# Patient Record
Sex: Female | Born: 1995 | Race: White | Hispanic: No | Smoking: Never smoker
Health system: Southern US, Community
[De-identification: ages and names within clinical notes are randomized; demographics above are authoritative.]

## PROBLEM LIST (undated history)

## (undated) DIAGNOSIS — R112 Nausea with vomiting, unspecified: Secondary | ICD-10-CM

## (undated) DIAGNOSIS — N83209 Unspecified ovarian cyst, unspecified side: Secondary | ICD-10-CM

## (undated) DIAGNOSIS — S069XAA Unspecified intracranial injury with loss of consciousness status unknown, initial encounter: Secondary | ICD-10-CM

## (undated) DIAGNOSIS — F32A Depression, unspecified: Secondary | ICD-10-CM

## (undated) DIAGNOSIS — S069X9A Unspecified intracranial injury with loss of consciousness of unspecified duration, initial encounter: Secondary | ICD-10-CM

## (undated) DIAGNOSIS — K5900 Constipation, unspecified: Secondary | ICD-10-CM

## (undated) DIAGNOSIS — F419 Anxiety disorder, unspecified: Secondary | ICD-10-CM

## (undated) DIAGNOSIS — N809 Endometriosis, unspecified: Secondary | ICD-10-CM

## (undated) DIAGNOSIS — Z9889 Other specified postprocedural states: Secondary | ICD-10-CM

## (undated) DIAGNOSIS — N200 Calculus of kidney: Secondary | ICD-10-CM

## (undated) HISTORY — DX: Other specified postprocedural states: R11.2

## (undated) HISTORY — DX: Other specified postprocedural states: Z98.890

## (undated) HISTORY — PX: LAPAROTOMY: SHX154

## (undated) HISTORY — PX: LAPAROSCOPY: SHX197

## (undated) HISTORY — DX: Depression, unspecified: F32.A

## (undated) HISTORY — PX: APPENDECTOMY: SHX54

## (undated) HISTORY — PX: COLONOSCOPY: SHX174

## (undated) HISTORY — DX: Anxiety disorder, unspecified: F41.9

---
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
## 2015-03-03 ENCOUNTER — Other Ambulatory Visit: Payer: Self-pay

## 2015-08-08 ENCOUNTER — Other Ambulatory Visit: Payer: Self-pay | Admitting: Obstetrics

## 2015-08-19 ENCOUNTER — Encounter (HOSPITAL_BASED_OUTPATIENT_CLINIC_OR_DEPARTMENT_OTHER): Payer: Self-pay | Admitting: *Deleted

## 2015-08-19 ENCOUNTER — Emergency Department (HOSPITAL_BASED_OUTPATIENT_CLINIC_OR_DEPARTMENT_OTHER)
Admission: EM | Admit: 2015-08-19 | Discharge: 2015-08-19 | Disposition: A | Discharge status: Home / Self Care | Payer: BC Managed Care – PPO | Attending: Emergency Medicine | Admitting: Emergency Medicine

## 2015-08-19 ENCOUNTER — Emergency Department (HOSPITAL_BASED_OUTPATIENT_CLINIC_OR_DEPARTMENT_OTHER): Payer: BC Managed Care – PPO

## 2015-08-19 DIAGNOSIS — Z79899 Other long term (current) drug therapy: Secondary | ICD-10-CM | POA: Insufficient documentation

## 2015-08-19 DIAGNOSIS — Z87442 Personal history of urinary calculi: Secondary | ICD-10-CM | POA: Insufficient documentation

## 2015-08-19 DIAGNOSIS — Z87820 Personal history of traumatic brain injury: Secondary | ICD-10-CM | POA: Insufficient documentation

## 2015-08-19 DIAGNOSIS — Z8742 Personal history of other diseases of the female genital tract: Secondary | ICD-10-CM | POA: Diagnosis not present

## 2015-08-19 DIAGNOSIS — K5900 Constipation, unspecified: Secondary | ICD-10-CM | POA: Insufficient documentation

## 2015-08-19 DIAGNOSIS — R319 Hematuria, unspecified: Secondary | ICD-10-CM | POA: Diagnosis present

## 2015-08-19 DIAGNOSIS — Z791 Long term (current) use of non-steroidal anti-inflammatories (NSAID): Secondary | ICD-10-CM | POA: Diagnosis not present

## 2015-08-19 DIAGNOSIS — Z3202 Encounter for pregnancy test, result negative: Secondary | ICD-10-CM | POA: Insufficient documentation

## 2015-08-19 DIAGNOSIS — R300 Dysuria: Secondary | ICD-10-CM | POA: Diagnosis not present

## 2015-08-19 LAB — URINE MICROSCOPIC-ADD ON

## 2015-08-19 LAB — BASIC METABOLIC PANEL
Anion gap: 10 (ref 5–15)
BUN: 11 mg/dL (ref 6–20)
CHLORIDE: 103 mmol/L (ref 101–111)
CO2: 27 mmol/L (ref 22–32)
Calcium: 9.3 mg/dL (ref 8.9–10.3)
Creatinine, Ser: 0.51 mg/dL (ref 0.44–1.00)
GFR calc Af Amer: >60 mL/min (ref >60)
GFR calc non Af Amer: >60 mL/min (ref >60)
GLUCOSE: 81 mg/dL (ref 65–99)
POTASSIUM: 3.6 mmol/L (ref 3.5–5.1)
SODIUM: 140 mmol/L (ref 135–145)

## 2015-08-19 LAB — CBC WITH DIFFERENTIAL/PLATELET
Basophils Absolute: 0 10*3/uL (ref 0.0–0.1)
Basophils Relative: 0 %
EOS PCT: 1 %
Eosinophils Absolute: 0.1 10*3/uL (ref 0.0–0.7)
HCT: 40.7 % (ref 36.0–46.0)
Hemoglobin: 13.4 g/dL (ref 12.0–15.0)
LYMPHS PCT: 15 %
Lymphs Abs: 2 10*3/uL (ref 0.7–4.0)
MCH: 26.9 pg (ref 26.0–34.0)
MCHC: 32.9 g/dL (ref 30.0–36.0)
MCV: 81.7 fL (ref 78.0–100.0)
MONOS PCT: 7 %
Monocytes Absolute: 1 10*3/uL (ref 0.1–1.0)
Neutro Abs: 10.4 10*3/uL — ABNORMAL HIGH (ref 1.7–7.7)
Neutrophils Relative %: 77 %
Platelets: 449 10*3/uL — ABNORMAL HIGH (ref 150–400)
RBC: 4.98 MIL/uL (ref 3.87–5.11)
RDW: 14.2 % (ref 11.5–15.5)
WBC: 13.5 10*3/uL — AB (ref 4.0–10.5)

## 2015-08-19 LAB — URINALYSIS, ROUTINE W REFLEX MICROSCOPIC
Bilirubin Urine: NEGATIVE
Glucose, UA: NEGATIVE mg/dL
Ketones, ur: NEGATIVE mg/dL
LEUKOCYTES UA: NEGATIVE
Nitrite: NEGATIVE
PROTEIN: 30 mg/dL — AB
Specific Gravity, Urine: 1.021 (ref 1.005–1.030)
pH: 8 (ref 5.0–8.0)

## 2015-08-19 LAB — PREGNANCY, URINE: Preg Test, Ur: NEGATIVE

## 2015-08-19 MED ORDER — NITROFURANTOIN MONOHYD MACRO 100 MG PO CAPS: 100.0000 mg | ORAL_CAPSULE | Freq: Two times a day (BID) | ORAL | Status: DC

## 2015-08-19 NOTE — ED Notes (Signed)
Explained to Pt. We will wait on the results of the [place] before she gets H2O.

## 2015-08-19 NOTE — ED Notes (Signed)
Kidney stone 2 weeks ago. Dysuria x 2 days and hematuria today.

## 2015-08-19 NOTE — ED Notes (Signed)
Water given to pt per her request.

## 2015-08-19 NOTE — ED Provider Notes (Signed)
CSN: [id number]     Arrival date & time 08/19/15  1056 History   First MD Initiated Contact with Patient 08/19/15 1216     Chief Complaint  Patient presents with  . Hematuria     (Consider location/radiation/quality/duration/timing/severity/associated sxs/prior Treatment) HPI Meredith Mcclain is a 20 y.o. female with history of appendectomy, kidney stones comes in for evaluation of hematuria and kidney stone pain. Patient reports 2 weeks ago she passed a stone and since that time has has had similar symptoms associated with kidney stone. She does report urinary burning, urgency, hesitancy and some blood in her urine. She has tried Azo with some relief of her symptoms. She denies any back or flank pain. No unusual vaginal bleeding or discharge, pelvic pain. She denies any fevers, chills, nausea or vomiting, diarrhea or constipation. Last bowel movement this morning and normal for her. Last menstrual period 2 weeks ago and normal. No discomfort in the ED.  Past Medical History  Diagnosis Date  . Kidney stone   . Constipation   . Endometriosis   . Ovarian cyst   . TBI (traumatic brain injury) [facility])    Past Surgical History  Procedure Laterality Date  . Appendectomy    . Laparoscopy    . Laparotomy    . Colonoscopy     No family history on file. Social History  Substance Use Topics  . Smoking status: Passive Smoke Exposure - Never Smoker  . Smokeless tobacco: None  . Alcohol Use: No   OB History    No data available     Review of Systems A 10 point review of systems was completed and was negative except for pertinent positives and negatives as mentioned in the history of present illness     Allergies  Review of patient's allergies indicates no known allergies.  Home Medications   Prior to Admission medications   Medication Sig Start Date End Date Taking? Authorizing Provider  DULoxetine (CYMBALTA) 60 MG capsule Take 60 mg by mouth daily.    Historical Provider, MD   ibuprofen (ADVIL,MOTRIN) 200 MG tablet Take 400 mg by mouth every 6 (six) hours as needed for pain.    Historical Provider, MD  meclizine (ANTIVERT) 25 MG tablet Take 25 mg by mouth 3 (three) times daily as needed (for vertigo).    Historical Provider, MD  naproxen (NAPROSYN) 500 MG tablet Take 1 tablet (500 mg total) by mouth 2 (two) times daily. 07/08/13   Niel Hummer, MD  nitrofurantoin, macrocrystal-monohydrate, (MACROBID) 100 MG capsule Take 1 capsule (100 mg total) by mouth 2 (two) times daily. 08/19/15   Joycie Peek, PA-C  omeprazole (PRILOSEC OTC) 20 MG tablet Take 20 mg by mouth daily.    Historical Provider, MD  senna (SENOKOT) 8.6 MG tablet Take 1 tablet by mouth daily.    Historical Provider, MD   BP 113/80 mmHg  Pulse 73  Temp(Src) 98.3 F (36.8 C) (Oral)  Resp 18  Ht  (1.549 m)  Wt 67.586 kg  BMI 28.17 kg/m2  SpO2 100%  LMP 08/05/2015 (Approximate) Physical Exam  Constitutional: She is oriented to person, place, and time. She appears well-developed and well-nourished. No distress.  Overall well-appearing Caucasian female  HENT:  Head: Normocephalic and atraumatic.  Mouth/Throat: Oropharynx is clear and moist.  Eyes: Conjunctivae are normal. Pupils are equal, round, and reactive to light. Right eye exhibits no discharge. Left eye exhibits no discharge. No scleral icterus.  Neck: Normal range of motion. Neck supple.  Cardiovascular: Normal rate, regular rhythm and normal heart sounds.   Pulmonary/Chest: Effort normal and breath sounds normal. No respiratory distress. She has no wheezes. She has no rales.  Abdominal: Soft. She exhibits no distension and no mass. There is no tenderness. There is no rebound and no guarding.  No CVA tenderness  Musculoskeletal: Normal range of motion. She exhibits no tenderness.  Neurological: She is alert and oriented to person, place, and time.  Cranial Nerves II-XII grossly intact  Skin: Skin is warm and dry. No rash noted. She  is not diaphoretic.  Psychiatric: She has a normal mood and affect.  Nursing note and vitals reviewed.   ED Course  Procedures (including critical care time) Labs Review Labs Reviewed  URINALYSIS, ROUTINE W REFLEX MICROSCOPIC (NOT AT [facility]) - Abnormal; Notable for the following:    Hgb urine dipstick LARGE (*)    Protein, ur 30 (*)    All other components within normal limits  URINE MICROSCOPIC-ADD ON - Abnormal; Notable for the following:    Squamous Epithelial / LPF 0-5 (*)    Bacteria, UA FEW (*)    All other components within normal limits  CBC WITH DIFFERENTIAL/PLATELET - Abnormal; Notable for the following:    WBC 13.5 (*)    Platelets 449 (*)    Neutro Abs 10.4 (*)    All other components within normal limits  URINE CULTURE  PREGNANCY, URINE  BASIC METABOLIC PANEL    Imaging Review US Renal  08/19/2015  CLINICAL DATA:  Hematuria and dysuria for 2 days. The patient reports she passed a kidney stone 2 weeks ago. Initial encounter. EXAM: RENAL / URINARY TRACT ULTRASOUND COMPLETE COMPARISON:  CT abdomen and pelvis 09/02/2012. FINDINGS: Right Kidney: Length: 9.7 cm. Echogenicity within normal limits. No mass or hydronephrosis visualized. Left Kidney: Length: 9.8 cm. Echogenicity within normal limits. No mass or hydronephrosis visualized. Bladder: Appears normal for degree of bladder distention. IMPRESSION: Negative exam. Electronically Signed   By: Drusilla Kanner M.D.   On: 08/19/2015 13:21   I have personally reviewed and evaluated these images and lab results as part of my medical decision-making.   EKG Interpretation None     Meds given in ED:  Medications - No data to display  Discharge Medication List as of 08/19/2015  3:19 PM    START taking these medications   Details  nitrofurantoin, macrocrystal-monohydrate, (MACROBID) 100 MG capsule Take 1 capsule (100 mg total) by mouth 2 (two) times daily., Starting 08/19/2015, Until Discontinued, Print       Filed  Vitals:   08/19/15 1104 08/19/15 1325 08/19/15 1520  BP: 122/80 116/83 113/80  Pulse: 95 80 73  Temp: 98.3 F (36.8 C)    TempSrc: Oral    Resp: Height:  (1.549 m)    Weight: 67.586 kg    SpO2: 100% 100% 100%    MDM  Meredith Mcclain is a 20 y.o. female history of kidney stones, comes in for evaluation of dysuria. Arrival, patient is hemodynamically stable with normal vital signs, afebrile. She appears very well. Exam is unremarkable, no abdominal tenderness or CVA tenderness. Denies any abdominal pain or back pain, nausea or vomiting. Urinalysis shows evidence of large hemoglobin with some protein. We'll obtain right upper quadrant ultrasound to evaluate for possible stone/hydronephrosis. Also will obtain basic labs to evaluate kidney function. Ultrasound is within normal limits. Creatinine is 0.51. She does have a nonspecific leukocytosis of 13.5 with 10.4 absolute neutrophils.  Given patient's complaint of dysuria, hemoglobin in the urine as well as leukocytosis, we'll treat empirically for UTI with Macrobid. Urine culture obtained. No evidence of septic stone, urosepsis, pyelonephritis. Overall, patient appears well, nontoxic, hemodynamically stable and appropriate for discharge. Also given referral to [facility] if symptoms do not improve. Encouraged follow-up with PCP next week. Patient verbalizes understanding and agrees with this plan as well as subsequent discharge. Prior to patient discharge, I discussed and reviewed this case with Dr.Rees    Final diagnoses:  Dysuria       Joycie Peek, PA-C 08/19/15 1624  Tilden Fossa, MD 08/20/15 [phone]

## 2015-08-19 NOTE — Discharge Instructions (Signed)
There does not appear to be an emergent cause your symptoms at this time. You will be treated for a urinary tract infection with antibiotics. Please take his medications as prescribed. Do not save or share these medications. Follow-up with your doctor for reevaluation as needed. You may also follow up with [facility] if your symptoms do not improve. Return to ED for any new or worsening symptoms as we discussed  Dysuria Dysuria is pain or discomfort while urinating. The pain or discomfort may be felt in the tube that carries urine out of the bladder (urethra) or in the surrounding tissue of the genitals. The pain may also be felt in the groin area, lower abdomen, and lower back. You may have to urinate frequently or have the sudden feeling that you have to urinate (urgency). Dysuria can affect both men and women, but is more common in women. Dysuria can be caused by many different things, including:  Urinary tract infection in women.  Infection of the kidney or bladder.  Kidney stones or bladder stones.  Certain sexually transmitted infections (STIs), such as chlamydia.  Dehydration.  Inflammation of the vagina.  Use of certain medicines.  Use of certain soaps or scented products that cause irritation. HOME CARE INSTRUCTIONS Watch your dysuria for any changes. The following actions may help to reduce any discomfort you are feeling:  Drink enough fluid to keep your urine clear or pale yellow.  Empty your bladder often. Avoid holding urine for long periods of time.  After a bowel movement or urination, women should cleanse from front to back, using each tissue only once.  Empty your bladder after sexual intercourse.  Take medicines only as directed by your health care provider.  If you were prescribed an antibiotic medicine, finish it all even if you start to feel better.  Avoid caffeine, tea, and alcohol. They can irritate the bladder and make dysuria worse. In  men, alcohol may irritate the prostate.  Keep all follow-up visits as directed by your health care provider. This is important.  If you had any tests done to find the cause of dysuria, it is your responsibility to obtain your test results. Ask the lab or department performing the test when and how you will get your results. Talk with your health care provider if you have any questions about your results. SEEK MEDICAL CARE IF:  You develop pain in your back or sides.  You have a fever.  You have nausea or vomiting.  You have blood in your urine.  You are not urinating as often as you usually do. SEEK IMMEDIATE MEDICAL CARE IF:  You pain is severe and not relieved with medicines.  You are unable to hold down any fluids.  You or someone else notices a change in your mental function.  You have a rapid heartbeat at rest.  You have shaking or chills.  You feel extremely weak.   This information is not intended to replace advice given to you by your health care provider. Make sure you discuss any questions you have with your health care provider.   Document Released: 04/06/2004 Document Revised: 07/30/2014 Document Reviewed: 03/04/2014 Elsevier Interactive Patient Education [employer]

## 2015-08-22 LAB — URINE CULTURE

## 2015-11-02 IMAGING — CT HEAD^HEAD_ROUTINE (ADULT)
1 series · 16 of 30 positions shown, 20 images · non-contrast
Comparison: None.

CLINICAL DATA: Headache

EXAM:
CT HEAD WITHOUT CONTRAST
TECHNIQUE: Contiguous axial images were obtained from the base of the skull
through the vertex without intravenous contrast.

[Series 2: head 5.0 h30s · axial · 0.42mm/px · z∈[+1407,+1542]mm · 16 of 31 slices shown, 20 images]
[im 2/31  brain]
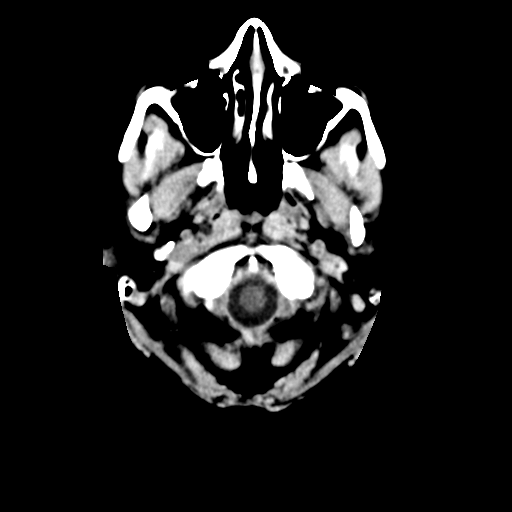
[im 2/31  bone]
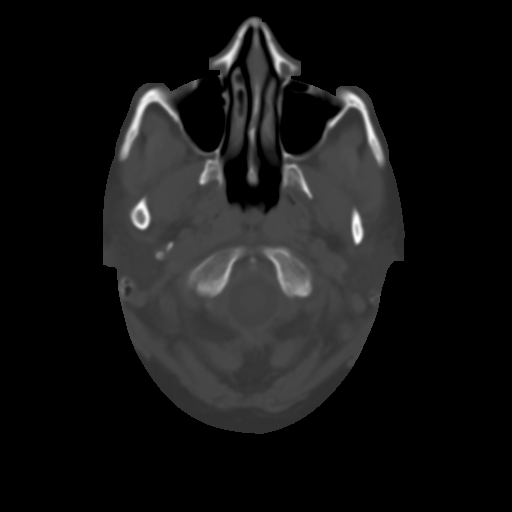
[im 4/31  brain]
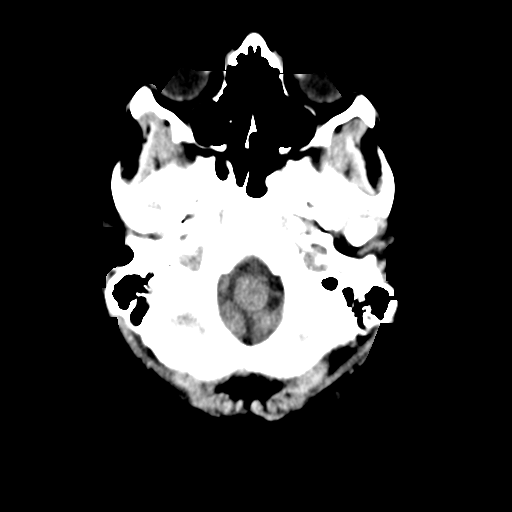
[im 6/31  brain]
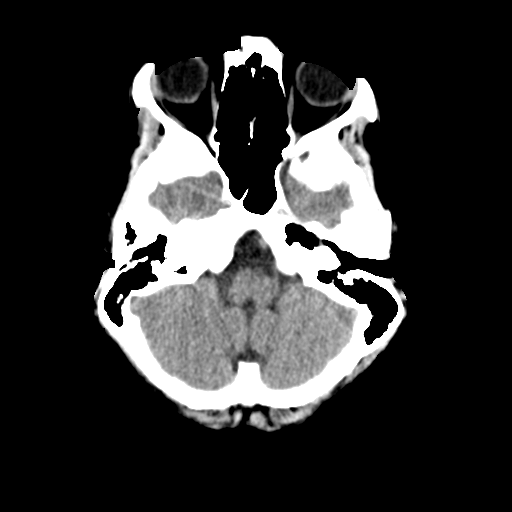
[im 8/31  brain]
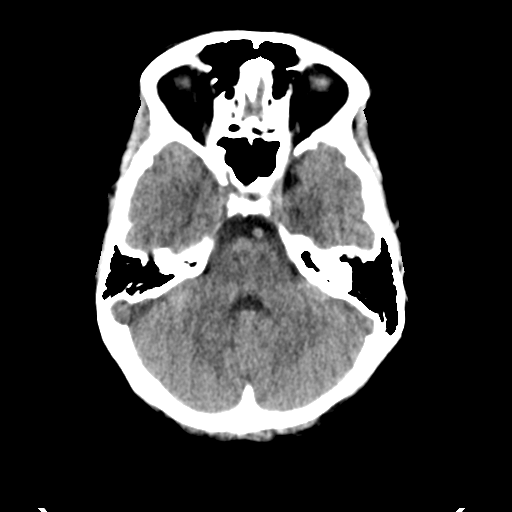
[im 9/31  brain]
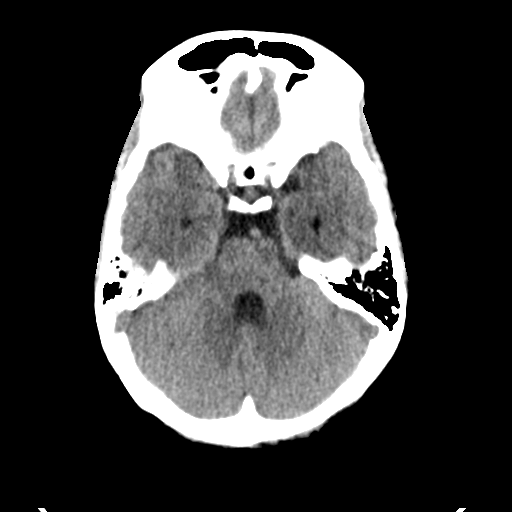
[im 9/31  bone]
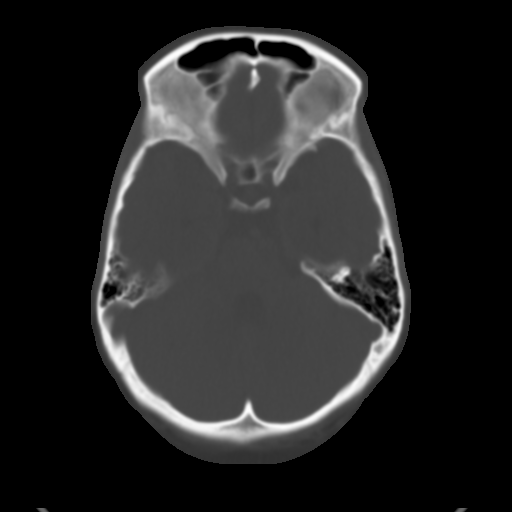
[im 11/31  brain]
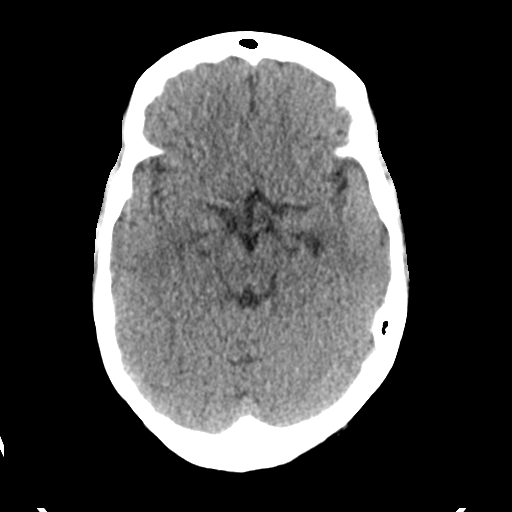
[im 13/31  brain]
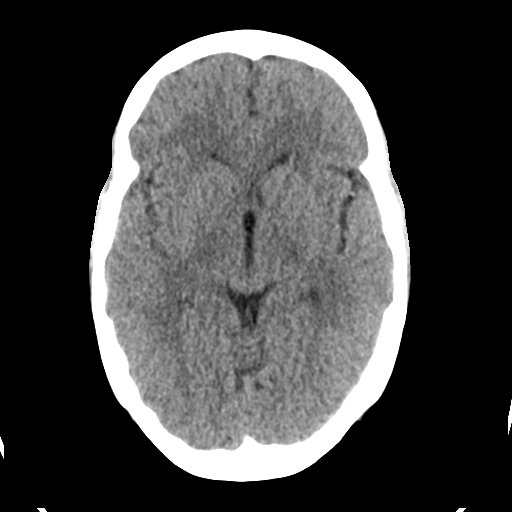
[im 15/31  brain]
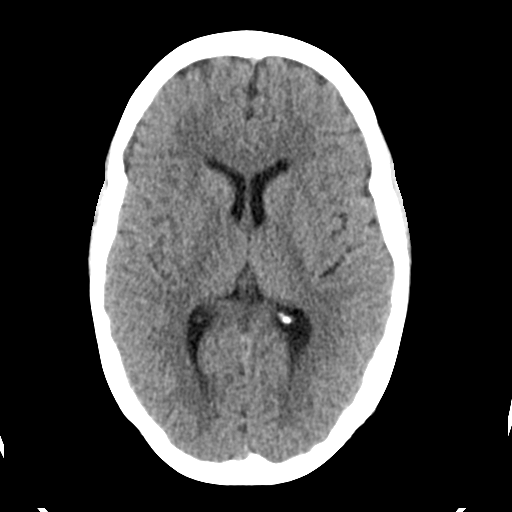
[im 16/31  brain]
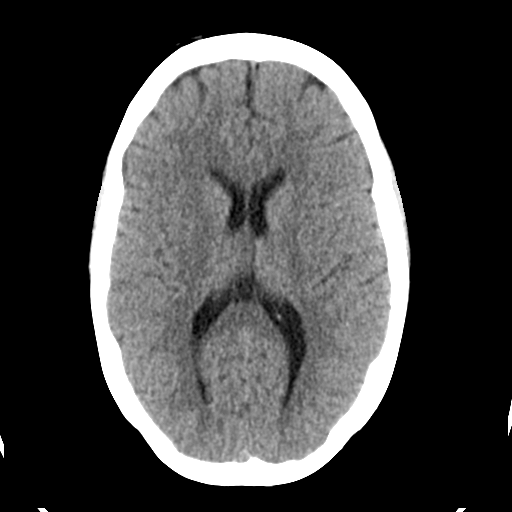
[im 16/31  bone]
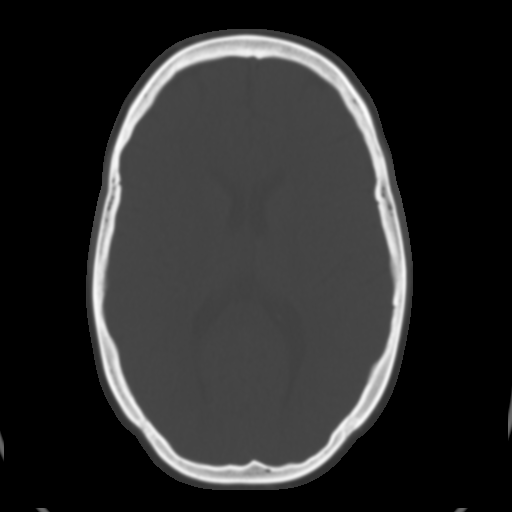
[im 18/31  brain]
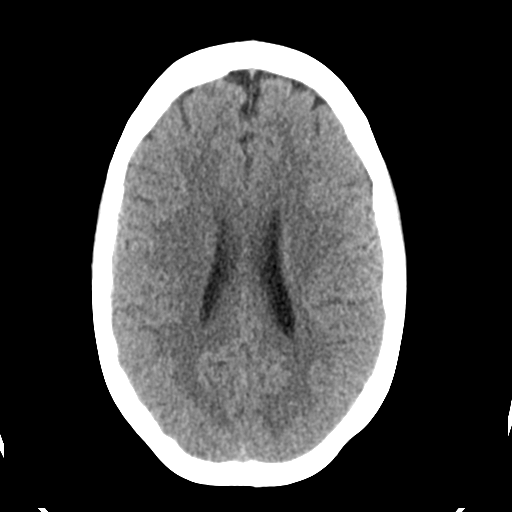
[im 20/31  brain]
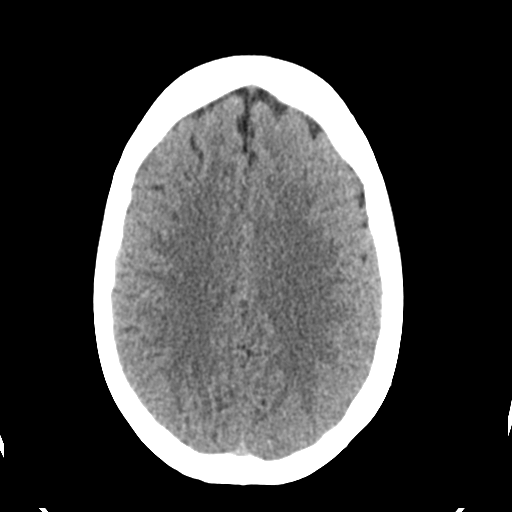
[im 22/31  brain]
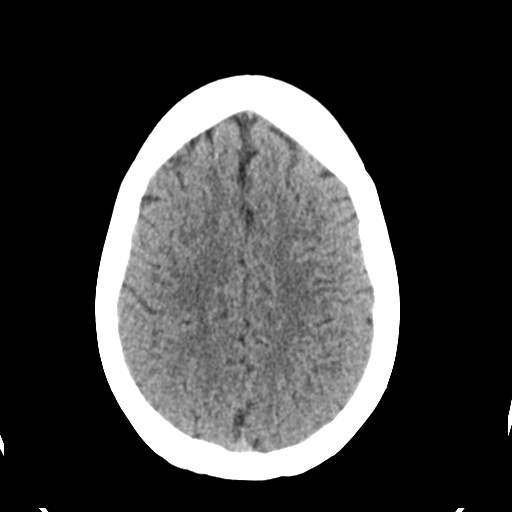
[im 23/31  brain]
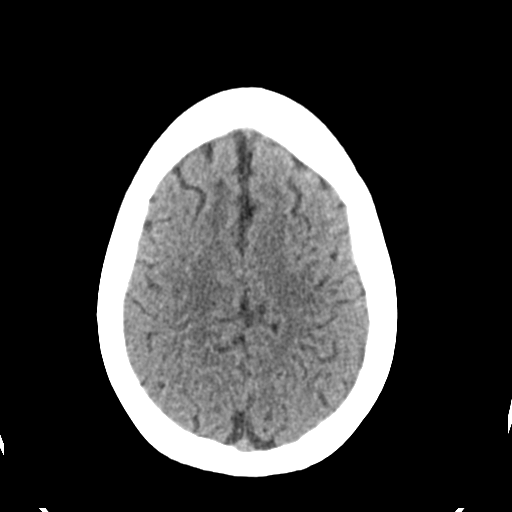
[im 23/31  bone]
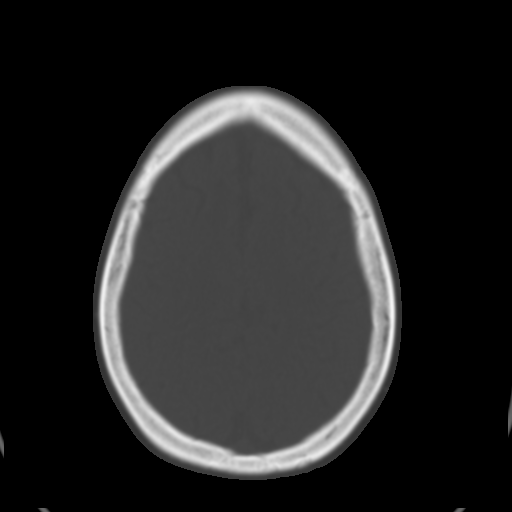
[im 25/31  brain]
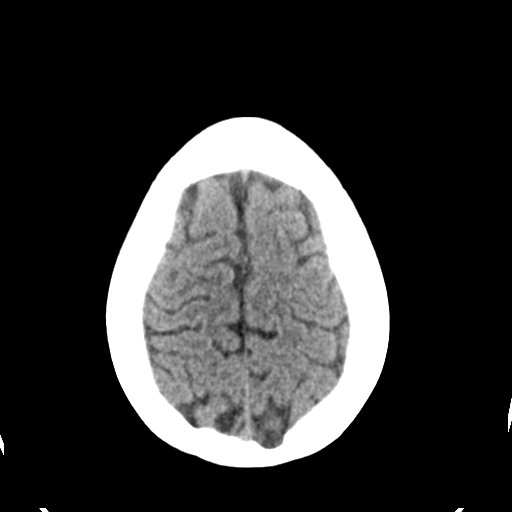
[im 27/31  brain]
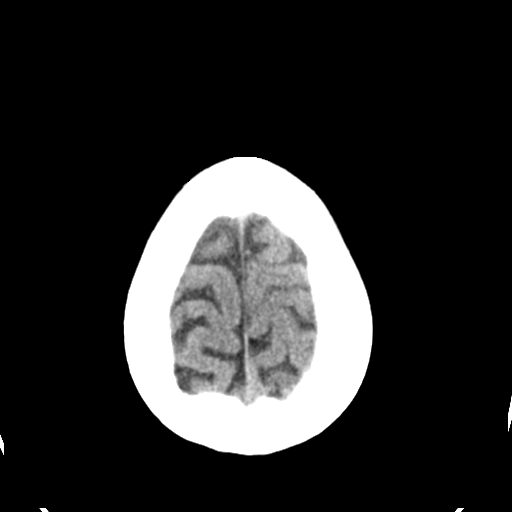
[im 29/31  brain]
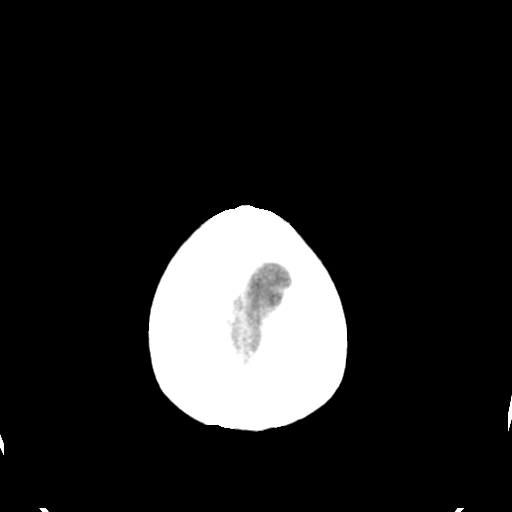

[16 of 30 positions shown; findings below may reference images not displayed]

FINDINGS: No skull fracture is noted. Paranasal sinuses and mastoid air cells
are unremarkable. No intracranial hemorrhage, mass effect or midline
shift. No acute infarction. No hydrocephalus. The gray and
white-matter differentiation is preserved.
IMPRESSION: No acute intracranial abnormality.

## 2017-10-04 ENCOUNTER — Ambulatory Visit (INDEPENDENT_AMBULATORY_CARE_PROVIDER_SITE_OTHER): Payer: BC Managed Care – PPO | Admitting: Physician Assistant

## 2017-10-04 ENCOUNTER — Encounter (INDEPENDENT_AMBULATORY_CARE_PROVIDER_SITE_OTHER): Payer: Self-pay | Admitting: Physician Assistant

## 2017-10-04 ENCOUNTER — Ambulatory Visit (INDEPENDENT_AMBULATORY_CARE_PROVIDER_SITE_OTHER): Payer: Self-pay

## 2017-10-04 ENCOUNTER — Ambulatory Visit (INDEPENDENT_AMBULATORY_CARE_PROVIDER_SITE_OTHER): Payer: BC Managed Care – PPO

## 2017-10-04 DIAGNOSIS — M222X1 Patellofemoral disorders, right knee: Secondary | ICD-10-CM | POA: Diagnosis not present
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
## 2018-02-05 ENCOUNTER — Telehealth (INDEPENDENT_AMBULATORY_CARE_PROVIDER_SITE_OTHER): Payer: Self-pay | Admitting: Orthopaedic Surgery

## 2018-02-05 NOTE — Telephone Encounter (Signed)
Returned call to patient  Left message to return call concerning her medical records being sent to [facility].

## 2019-01-19 ENCOUNTER — Other Ambulatory Visit (HOSPITAL_COMMUNITY): Payer: Self-pay | Admitting: Internal Medicine

## 2019-01-19 ENCOUNTER — Other Ambulatory Visit: Payer: Self-pay | Admitting: Internal Medicine

## 2019-01-19 DIAGNOSIS — N644 Mastodynia: Secondary | ICD-10-CM

## 2019-01-27 ENCOUNTER — Ambulatory Visit (HOSPITAL_COMMUNITY)
Admission: RE | Admit: 2019-01-27 | Discharge: 2019-01-27 | Disposition: A | Discharge status: Home / Self Care | Payer: PRIVATE HEALTH INSURANCE | Source: Ambulatory Visit | Attending: Internal Medicine | Admitting: Internal Medicine

## 2019-01-27 ENCOUNTER — Other Ambulatory Visit: Payer: Self-pay

## 2019-01-27 DIAGNOSIS — N644 Mastodynia: Secondary | ICD-10-CM | POA: Diagnosis not present

## 2019-05-07 ENCOUNTER — Other Ambulatory Visit: Payer: Self-pay

## 2019-05-07 DIAGNOSIS — Z20822 Contact with and (suspected) exposure to covid-19: Secondary | ICD-10-CM

## 2019-05-08 ENCOUNTER — Telehealth: Payer: Self-pay | Admitting: Internal Medicine

## 2019-05-08 LAB — NOVEL CORONAVIRUS, NAA: SARS-CoV-2, NAA: NOT DETECTED

## 2019-05-08 NOTE — Telephone Encounter (Signed)
Patient is calling to receive her negative COVID testing results. Patient expressed understanding. °

## 2019-07-29 ENCOUNTER — Ambulatory Visit
Admission: EM | Admit: 2019-07-29 | Discharge: 2019-07-29 | Disposition: A | Discharge status: Home / Self Care | Payer: PRIVATE HEALTH INSURANCE | Attending: Emergency Medicine | Admitting: Emergency Medicine

## 2019-07-29 ENCOUNTER — Other Ambulatory Visit: Payer: Self-pay

## 2019-07-29 ENCOUNTER — Encounter: Payer: Self-pay | Admitting: Emergency Medicine

## 2019-07-29 DIAGNOSIS — R109 Unspecified abdominal pain: Secondary | ICD-10-CM | POA: Insufficient documentation

## 2019-07-29 LAB — POCT URINALYSIS DIP (MANUAL ENTRY)
Bilirubin, UA: NEGATIVE
Glucose, UA: NEGATIVE mg/dL
Leukocytes, UA: NEGATIVE
Nitrite, UA: NEGATIVE
Protein Ur, POC: NEGATIVE mg/dL
Spec Grav, UA: 1.025 (ref 1.010–1.025)
Urobilinogen, UA: 0.2 E.U./dL
pH, UA: 7 (ref 5.0–8.0)

## 2019-07-29 LAB — POCT URINE PREGNANCY: Preg Test, Ur: NEGATIVE

## 2019-07-29 MED ORDER — IBUPROFEN 600 MG PO TABS: 600.0000 mg | ORAL_TABLET | Freq: Four times a day (QID) | ORAL | 0 refills | Status: DC | PRN

## 2019-07-29 NOTE — Discharge Instructions (Addendum)
Urine analysis showed trace of blood Urine culture sent.  We will call you with the results.   Push fluids and get plenty of rest.   Follow up with PCP if symptoms persists Return here or go to ER if you have any new or worsening symptoms such as fever, worsening abdominal pain, nausea/vomiting, flank pain

## 2019-07-29 NOTE — ED Triage Notes (Signed)
Right flank pain started last night.  Urinating without difficulty .denies diarrhea, but softer stools and more frequent that usual.  No vomiting

## 2019-07-29 NOTE — ED Provider Notes (Signed)
[facility]    CSN: [id number] Arrival date & time: 07/29/19  1556      History   Chief Complaint Chief Complaint  Patient presents with  . Flank Pain    HPI Meredith Mcclain is a 24 y.o. female.   Meredith Mcclain 24 years old female presented to the urgent care with a complaint of flank pain for the past 1 day.  She denies a precipitating event or recent sexual encounter.  She has tried Ibuprofen with mild relief.  Her symptoms are made worse with urination.  She reports similar symptoms in the past that improved with antibiotic treatment.  She denied  decreased amount, increased urgency.  She denies fever, chills, nausea, vomiting, abdominal pain, hematuria, or incontinence  The history is provided by the patient. A language interpreter was used.  Flank Pain    Past Medical History:  Diagnosis Date  . Constipation   . Endometriosis   . Kidney stone   . Ovarian cyst   . TBI (traumatic brain injury) [facility]     Patient Active Problem List   Diagnosis Date Noted  . 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 APPENDECTOMY    . COLONOSCOPY    . LAPAROSCOPY    . LAPAROTOMY      OB History   No obstetric history on file.      Home Medications    Prior to Admission medications   Medication Sig Start Date End Date Taking? Authorizing Provider  ibuprofen (ADVIL) 600 MG tablet Take 1 tablet (600 mg total) by mouth every 6 (six) hours as needed. Take with food 07/29/19   Valetta Mulroy, Darrelyn Hillock, FNP  Multiple Vitamins-Minerals (MULTIVITAMIN ADULT PO) Microgestin 1/20 (21) 1 mg-20 mcg tablet    [provider]    Family History History reviewed. No pertinent family history.  Social History Social History   Tobacco Use  . Smoking status: Passive Smoke Exposure - Never Smoker  . Smokeless tobacco: Never Used  Substance Use Topics  . Alcohol use: Yes  . Drug use: Not on file      Allergies   Patient has no known allergies.   Review of Systems Review of Systems  Constitutional: Negative.   HENT: Negative.   Respiratory: Negative.   Cardiovascular: Negative.   Gastrointestinal: Negative.   Genitourinary: Positive for flank pain.  ROS: All other are negative   Physical Exam Triage Vital Signs ED Triage Vitals  Enc Vitals Group     BP 07/29/19 1609 129/88     Pulse Rate 07/29/19 1609 100     Resp 07/29/19 1609 20     Temp 07/29/19 1609 98 F (36.7 C)     Temp Source 07/29/19 1609 Oral     SpO2 07/29/19 1609 98 %     Weight --      Height --      Head Circumference --      Peak Flow --      Pain Score 07/29/19 1605 7     Pain Loc --      Pain Edu? --      Excl. in [place]? --    No data found.  Updated Vital Signs BP 129/88 (BP Location: Right Arm)   Pulse 100   Temp 98 F (36.7 C) (Oral)   Resp 20   SpO2 98%   Visual Acuity Right Eye Distance:   Left Eye  Distance:   Bilateral Distance:    Right Eye Near:   Left Eye Near:    Bilateral Near:     Physical Exam Vitals and nursing note reviewed.  Constitutional:      General: She is not in acute distress.    Appearance: Normal appearance. She is normal weight. She is not ill-appearing or toxic-appearing.  Cardiovascular:     Rate and Rhythm: Normal rate and regular rhythm.     Pulses: Normal pulses.     Heart sounds: Normal heart sounds. No murmur.  Pulmonary:     Effort: Pulmonary effort is normal. No respiratory distress.     Breath sounds: No wheezing or rhonchi.  Chest:     Chest wall: No tenderness.  Abdominal:     General: Abdomen is flat. Bowel sounds are normal. There is no distension.     Palpations: There is no mass.     Tenderness: There is no abdominal tenderness. There is no right CVA tenderness, left CVA tenderness, guarding or rebound.     Hernia: No hernia is present.  Skin:    Capillary Refill: Capillary refill takes less than 2 seconds.  Neurological:      Mental Status: She is alert and oriented to person, place, and time.      UC Treatments / Results  Labs (all labs ordered are listed, but only abnormal results are displayed) Labs Reviewed  POCT URINALYSIS DIP (MANUAL ENTRY) - Abnormal; Notable for the following components:      Result Value   Color, UA yellow (*)    Ketones, POC UA trace (5) (*)    Blood, UA trace-intact (*)    All other components within normal limits  URINE CULTURE  POCT URINE PREGNANCY    EKG   Radiology No results found.  Procedures Procedures (including critical care time)  Medications Ordered in UC Medications - No data to display  Initial Impression / Assessment and Plan / UC Course  I have reviewed the triage vital signs and the nursing notes.  Pertinent labs & imaging results that were available during my care of the patient were reviewed by me and considered in my medical decision making (see chart for details).   Point-of-care urine analysis and  urine pregnancy test were ordered and result reviewed.  UA show trace of red blood cell and trace of bilirubin.  Urine will be sent for culture.  Advised patient to return for worsening of symptoms.  Patient verbalized an understanding of the plan of care.  Final Clinical Impressions(s) / UC Diagnoses   Final diagnoses:  Flank pain     Discharge Instructions     Urine analysis showed trace of blood Urine culture sent.  We will call you with the results.   Push fluids and get plenty of rest.   Follow up with PCP if symptoms persists Return here or go to ER if you have any new or worsening symptoms such as fever, worsening abdominal pain, nausea/vomiting, flank pain     ED Prescriptions    Medication Sig Dispense Auth. Provider   ibuprofen (ADVIL) 600 MG tablet Take 1 tablet (600 mg total) by mouth every 6 (six) hours as needed. Take with food 30 tablet Makhayla Mcmurry, Zachery Dakins, FNP     PDMP not reviewed this encounter.   Durward Parcel,  FNP 07/29/19 1639

## 2019-07-31 ENCOUNTER — Telehealth: Payer: Self-pay | Admitting: Emergency Medicine

## 2019-07-31 LAB — URINE CULTURE

## 2019-07-31 MED ORDER — CEPHALEXIN 500 MG PO CAPS: 500.0000 mg | ORAL_CAPSULE | Freq: Two times a day (BID) | ORAL | 0 refills | Status: AC

## 2019-07-31 MED ORDER — PHENAZOPYRIDINE HCL 200 MG PO TABS: 200.0000 mg | ORAL_TABLET | Freq: Three times a day (TID) | ORAL | 0 refills | Status: DC

## 2019-07-31 NOTE — Telephone Encounter (Signed)
Pt called with persistent symptoms.  Urine with unspecified growth.  Will treat for possible UTI.  Keflex and pyridium sent into [facility] pharmacy.

## 2019-07-31 NOTE — ED Notes (Signed)
Returned pt call no answer; message left

## 2019-12-17 LAB — OB RESULTS CONSOLE GC/CHLAMYDIA
Chlamydia: NEGATIVE
Gonorrhea: NEGATIVE

## 2020-01-18 LAB — OB RESULTS CONSOLE ABO/RH: RH Type: POSITIVE

## 2020-01-18 LAB — OB RESULTS CONSOLE RPR: RPR: NONREACTIVE

## 2020-01-18 LAB — OB RESULTS CONSOLE HEPATITIS B SURFACE ANTIGEN: Hepatitis B Surface Ag: NEGATIVE

## 2020-01-18 LAB — OB RESULTS CONSOLE RUBELLA ANTIBODY, IGM: Rubella: IMMUNE

## 2020-01-18 LAB — OB RESULTS CONSOLE HIV ANTIBODY (ROUTINE TESTING): HIV: NONREACTIVE

## 2020-06-15 ENCOUNTER — Encounter: Payer: BC Managed Care – PPO | Attending: Obstetrics & Gynecology | Admitting: Registered"

## 2020-06-15 ENCOUNTER — Other Ambulatory Visit: Payer: Self-pay

## 2020-06-15 DIAGNOSIS — O24419 Gestational diabetes mellitus in pregnancy, unspecified control: Secondary | ICD-10-CM | POA: Insufficient documentation

## 2020-06-20 ENCOUNTER — Encounter: Payer: Self-pay | Admitting: Registered"

## 2020-06-20 DIAGNOSIS — O24419 Gestational diabetes mellitus in pregnancy, unspecified control: Secondary | ICD-10-CM | POA: Insufficient documentation

## 2020-06-20 NOTE — Progress Notes (Signed)
Patient was seen on 06/15/20 for Gestational Diabetes self-management class at the [facility]. The following learning objectives were met by the patient during this course:   States the definition of Gestational Diabetes  States why dietary management is important in controlling blood glucose  Describes the effects each nutrient has on blood glucose levels  Demonstrates ability to create a balanced meal plan  Demonstrates carbohydrate counting   States when to check blood glucose levels  Demonstrates proper blood glucose monitoring techniques  States the effect of stress and exercise on blood glucose levels  States the importance of limiting caffeine and abstaining from alcohol and smoking  Blood glucose monitor given: none - patient has meter and checking CBGs  Patient instructed to monitor glucose levels: FBS: 60 - <95; 1 hour: <140; 2 hour: <120  Patient received handouts:  Nutrition Diabetes and Pregnancy, including carb counting list  Patient will be seen for follow-up as needed.

## 2020-06-24 ENCOUNTER — Other Ambulatory Visit: Payer: Self-pay | Admitting: Obstetrics and Gynecology

## 2020-06-24 DIAGNOSIS — O99213 Obesity complicating pregnancy, third trimester: Secondary | ICD-10-CM

## 2020-06-24 DIAGNOSIS — O24410 Gestational diabetes mellitus in pregnancy, diet controlled: Secondary | ICD-10-CM

## 2020-06-24 DIAGNOSIS — Z3A35 35 weeks gestation of pregnancy: Secondary | ICD-10-CM

## 2020-06-24 DIAGNOSIS — Z363 Encounter for antenatal screening for malformations: Secondary | ICD-10-CM

## 2020-07-06 ENCOUNTER — Encounter: Payer: Self-pay | Admitting: *Deleted

## 2020-07-08 ENCOUNTER — Encounter: Payer: Self-pay | Admitting: *Deleted

## 2020-07-08 ENCOUNTER — Other Ambulatory Visit (HOSPITAL_COMMUNITY): Payer: Self-pay | Admitting: Obstetrics and Gynecology

## 2020-07-08 ENCOUNTER — Ambulatory Visit: Payer: PRIVATE HEALTH INSURANCE | Admitting: *Deleted

## 2020-07-08 ENCOUNTER — Other Ambulatory Visit: Payer: Self-pay

## 2020-07-08 ENCOUNTER — Ambulatory Visit: Payer: PRIVATE HEALTH INSURANCE | Attending: Obstetrics and Gynecology

## 2020-07-08 VITALS — BP 112/55 | HR 111

## 2020-07-08 DIAGNOSIS — O24419 Gestational diabetes mellitus in pregnancy, unspecified control: Secondary | ICD-10-CM | POA: Diagnosis present

## 2020-07-08 DIAGNOSIS — Z363 Encounter for antenatal screening for malformations: Secondary | ICD-10-CM | POA: Diagnosis present

## 2020-07-08 DIAGNOSIS — O24410 Gestational diabetes mellitus in pregnancy, diet controlled: Secondary | ICD-10-CM

## 2020-07-08 DIAGNOSIS — O99213 Obesity complicating pregnancy, third trimester: Secondary | ICD-10-CM

## 2020-07-08 DIAGNOSIS — Z3A35 35 weeks gestation of pregnancy: Secondary | ICD-10-CM | POA: Diagnosis present

## 2020-07-16 ENCOUNTER — Inpatient Hospital Stay (HOSPITAL_COMMUNITY)
Admission: AD | Admit: 2020-07-16 | Discharge: 2020-07-16 | Disposition: A | Discharge status: Home / Self Care | Payer: PRIVATE HEALTH INSURANCE | Source: Ambulatory Visit | Attending: Obstetrics and Gynecology | Admitting: Obstetrics and Gynecology

## 2020-07-16 ENCOUNTER — Encounter (HOSPITAL_COMMUNITY): Payer: Self-pay | Admitting: Obstetrics and Gynecology

## 2020-07-16 ENCOUNTER — Other Ambulatory Visit: Payer: Self-pay

## 2020-07-16 DIAGNOSIS — O479 False labor, unspecified: Secondary | ICD-10-CM

## 2020-07-16 DIAGNOSIS — Z3A36 36 weeks gestation of pregnancy: Secondary | ICD-10-CM | POA: Insufficient documentation

## 2020-07-16 NOTE — Discharge Instructions (Signed)
Braxton Hicks Contractions °Contractions of the uterus can occur throughout pregnancy, but they are not always a sign that you are in labor. You may have practice contractions called Braxton Hicks contractions. These false labor contractions are sometimes confused with true labor. °What are Braxton Hicks contractions? °Braxton Hicks contractions are tightening movements that occur in the muscles of the uterus before labor. Unlike true labor contractions, these contractions do not result in opening (dilation) and thinning of the cervix. Toward the end of pregnancy (32-34 weeks), Braxton Hicks contractions can happen more often and may become stronger. These contractions are sometimes difficult to tell apart from true labor because they can be very uncomfortable. You should not feel embarrassed if you go to the hospital with false labor. °Sometimes, the only way to tell if you are in true labor is for your health care provider to look for changes in the cervix. The health care provider will do a physical exam and may monitor your contractions. If you are not in true labor, the exam should show that your cervix is not dilating and your water has not broken. °If there are no other health problems associated with your pregnancy, it is completely safe for you to be sent home with false labor. You may continue to have Braxton Hicks contractions until you go into true labor. °How to tell the difference between true labor and false labor °True labor °· Contractions last 30-70 seconds. °· Contractions become very regular. °· Discomfort is usually felt in the top of the uterus, and it spreads to the lower abdomen and low back. °· Contractions do not go away with walking. °· Contractions usually become more intense and increase in frequency. °· The cervix dilates and gets thinner. °False labor °· Contractions are usually shorter and not as strong as true labor contractions. °· Contractions are usually irregular. °· Contractions  are often felt in the front of the lower abdomen and in the groin. °· Contractions may go away when you walk around or change positions while lying down. °· Contractions get weaker and are shorter-lasting as time goes on. °· The cervix usually does not dilate or become thin. °Follow these instructions at home: ° °· Take over-the-counter and prescription medicines only as told by your health care provider. °· Keep up with your usual exercises and follow other instructions from your health care provider. °· Eat and drink lightly if you think you are going into labor. °· If Braxton Hicks contractions are making you uncomfortable: °? Change your position from lying down or resting to walking, or change from walking to resting. °? Sit and rest in a tub of warm water. °? Drink enough fluid to keep your urine pale yellow. Dehydration may cause these contractions. °? Do slow and deep breathing several times an hour. °· Keep all follow-up prenatal visits as told by your health care provider. This is important. °Contact a health care provider if: °· You have a fever. °· You have continuous pain in your abdomen. °Get help right away if: °· Your contractions become stronger, more regular, and closer together. °· You have fluid leaking or gushing from your vagina. °· You pass blood-tinged mucus (bloody show). °· You have bleeding from your vagina. °· You have low back pain that you never had before. °· You feel your baby’s head pushing down and causing pelvic pressure. °· Your baby is not moving inside you as much as it used to. °Summary °· Contractions that occur before labor are   called Braxton Hicks contractions, false labor, or practice contractions. °· Braxton Hicks contractions are usually shorter, weaker, farther apart, and less regular than true labor contractions. True labor contractions usually become progressively stronger and regular, and they become more frequent. °· Manage discomfort from Braxton Hicks contractions  by changing position, resting in a warm bath, drinking plenty of water, or practicing deep breathing. °This information is not intended to replace advice given to you by your health care provider. Make sure you discuss any questions you have with your health care provider. °Document Revised: 06/21/2017 Document Reviewed: 11/22/2016 °Elsevier Patient Education © 2020 Elsevier Inc. ° °

## 2020-07-16 NOTE — MAU Note (Signed)
.  Meredith Mcclain is a 24 y.o. at [redacted]w[redacted]d here in MAU reporting: ctx that started around 1500 this afternoon. They are 10 minutes apart now. No VB or LOF. Endorses good fetal movement.

## 2020-07-20 LAB — OB RESULTS CONSOLE GBS: GBS: NEGATIVE

## 2020-07-23 ENCOUNTER — Encounter (HOSPITAL_COMMUNITY): Payer: Self-pay | Admitting: Obstetrics and Gynecology

## 2020-07-23 ENCOUNTER — Other Ambulatory Visit: Payer: Self-pay

## 2020-07-23 ENCOUNTER — Inpatient Hospital Stay (HOSPITAL_COMMUNITY)
Admission: AD | Admit: 2020-07-23 | Discharge: 2020-07-23 | Disposition: A | Discharge status: Home / Self Care | Payer: PRIVATE HEALTH INSURANCE | Attending: Obstetrics and Gynecology | Admitting: Obstetrics and Gynecology

## 2020-07-23 DIAGNOSIS — Z0371 Encounter for suspected problem with amniotic cavity and membrane ruled out: Secondary | ICD-10-CM | POA: Diagnosis present

## 2020-07-23 DIAGNOSIS — Z3A37 37 weeks gestation of pregnancy: Secondary | ICD-10-CM | POA: Insufficient documentation

## 2020-07-23 DIAGNOSIS — O471 False labor at or after 37 completed weeks of gestation: Secondary | ICD-10-CM

## 2020-07-23 DIAGNOSIS — Z3483 Encounter for supervision of other normal pregnancy, third trimester: Secondary | ICD-10-CM | POA: Insufficient documentation

## 2020-07-23 NOTE — Discharge Instructions (Signed)
Braxton Hicks Contractions Contractions of the uterus can occur throughout pregnancy, but they are not always a sign that you are in labor. You may have practice contractions called Braxton Hicks contractions. These false labor contractions are sometimes confused with true labor. What are Braxton Hicks contractions? Braxton Hicks contractions are tightening movements that occur in the muscles of the uterus before labor. Unlike true labor contractions, these contractions do not result in opening (dilation) and thinning of the cervix. Toward the end of pregnancy (32-34 weeks), Braxton Hicks contractions can happen more often and may become stronger. These contractions are sometimes difficult to tell apart from true labor because they can be very uncomfortable. You should not feel embarrassed if you go to the hospital with false labor. Sometimes, the only way to tell if you are in true labor is for your health care provider to look for changes in the cervix. The health care provider will do a physical exam and may monitor your contractions. If you are not in true labor, the exam should show that your cervix is not dilating and your water has not broken. If there are no other health problems associated with your pregnancy, it is completely safe for you to be sent home with false labor. You may continue to have Braxton Hicks contractions until you go into true labor. How to tell the difference between true labor and false labor True labor  Contractions last 30-70 seconds.  Contractions become very regular.  Discomfort is usually felt in the top of the uterus, and it spreads to the lower abdomen and low back.  Contractions do not go away with walking.  Contractions usually become more intense and increase in frequency.  The cervix dilates and gets thinner. False labor  Contractions are usually shorter and not as strong as true labor contractions.  Contractions are usually irregular.  Contractions  are often felt in the front of the lower abdomen and in the groin.  Contractions may go away when you walk around or change positions while lying down.  Contractions get weaker and are shorter-lasting as time goes on.  The cervix usually does not dilate or become thin. Follow these instructions at home:   Take over-the-counter and prescription medicines only as told by your health care provider.  Keep up with your usual exercises and follow other instructions from your health care provider.  Eat and drink lightly if you think you are going into labor.  If Braxton Hicks contractions are making you uncomfortable: ? Change your position from lying down or resting to walking, or change from walking to resting. ? Sit and rest in a tub of warm water. ? Drink enough fluid to keep your urine pale yellow. Dehydration may cause these contractions. ? Do slow and deep breathing several times an hour.  Keep all follow-up prenatal visits as told by your health care provider. This is important. Contact a health care provider if:  You have a fever.  You have continuous pain in your abdomen. Get help right away if:  Your contractions become stronger, more regular, and closer together.  You have fluid leaking or gushing from your vagina.  You pass blood-tinged mucus (bloody show).  You have bleeding from your vagina.  You have low back pain that you never had before.  You feel your baby's head pushing down and causing pelvic pressure.  Your baby is not moving inside you as much as it used to. Summary  Contractions that occur before labor are   called Braxton Hicks contractions, false labor, or practice contractions.  Braxton Hicks contractions are usually shorter, weaker, farther apart, and less regular than true labor contractions. True labor contractions usually become progressively stronger and regular, and they become more frequent.  Manage discomfort from Braxton Hicks contractions  by changing position, resting in a warm bath, drinking plenty of water, or practicing deep breathing. This information is not intended to replace advice given to you by your health care provider. Make sure you discuss any questions you have with your health care provider. Document Revised: 06/21/2017 Document Reviewed: 11/22/2016 Elsevier Patient Education  2020 Elsevier Inc. Fetal Movement Counts Patient Name: ________________________________________________ Patient Due Date: ____________________ What is a fetal movement count?  A fetal movement count is the number of times that you feel your baby move during a certain amount of time. This may also be called a fetal kick count. A fetal movement count is recommended for every pregnant woman. You may be asked to start counting fetal movements as early as week 28 of your pregnancy. Pay attention to when your baby is most active. You may notice your baby's sleep and wake cycles. You may also notice things that make your baby move more. You should do a fetal movement count:  When your baby is normally most active.  At the same time each day. A good time to count movements is while you are resting, after having something to eat and drink. How do I count fetal movements? 1. Find a quiet, comfortable area. Sit, or lie down on your side. 2. Write down the date, the start time and stop time, and the number of movements that you felt between those two times. Take this information with you to your health care visits. 3. Write down your start time when you feel the first movement. 4. Count kicks, flutters, swishes, rolls, and jabs. You should feel at least 10 movements. 5. You may stop counting after you have felt 10 movements, or if you have been counting for 2 hours. Write down the stop time. 6. If you do not feel 10 movements in 2 hours, contact your health care provider for further instructions. Your health care provider may want to do additional tests  to assess your baby's well-being. Contact a health care provider if:  You feel fewer than 10 movements in 2 hours.  Your baby is not moving like he or she usually does. Date: ____________ Start time: ____________ Stop time: ____________ Movements: ____________ Date: ____________ Start time: ____________ Stop time: ____________ Movements: ____________ Date: ____________ Start time: ____________ Stop time: ____________ Movements: ____________ Date: ____________ Start time: ____________ Stop time: ____________ Movements: ____________ Date: ____________ Start time: ____________ Stop time: ____________ Movements: ____________ Date: ____________ Start time: ____________ Stop time: ____________ Movements: ____________ Date: ____________ Start time: ____________ Stop time: ____________ Movements: ____________ Date: ____________ Start time: ____________ Stop time: ____________ Movements: ____________ Date: ____________ Start time: ____________ Stop time: ____________ Movements: ____________ This information is not intended to replace advice given to you by your health care provider. Make sure you discuss any questions you have with your health care provider. Document Revised: 02/26/2019 Document Reviewed: 02/26/2019 Elsevier Patient Education  2020 Elsevier Inc.  

## 2020-07-23 NOTE — MAU Note (Signed)
I have communicated with Lynnda Shields, MD., and reviewed vital signs:  Vitals:   07/23/20 0359 07/23/20 0418  BP: 131/83 133/80  Pulse: (!) 108 95  SpO2:  98%    Vaginal exam:  Dilation: 1 Effacement (%): 60 Station: -2 Presentation: Vertex Exam by:: Dr. Lynnda Shields, MD,   Also reviewed contraction pattern and that non-stress test is reactive.  It has been documented that patient is contracting occasionally with no cervical change since previous check not indicating active labor.  Patient denies any other complaints.  Based on this report provider has given order for discharge.  A discharge order and diagnosis entered by a provider.   Labor discharge instructions reviewed with patient.

## 2020-07-23 NOTE — MAU Provider Note (Signed)
Event Date/Time  First Provider Initiated Contact with Patient 07/23/20 [phone]    S: Ms. Meredith Mcclain is a 25 y.o. G2P0010 at [redacted]w[redacted]d  who presents to MAU today complaining of leaking of fluid since approximately 0200 s/p vaginal intercourse at 2300. She denies vaginal bleeding. She endorses rare contractions. She reports normal fetal movement.    O: BP 133/80 (BP Location: Left Arm)   Pulse 95   LMP 11/05/2019   SpO2 98%  GENERAL: Well-developed, well-nourished female in no acute distress.  HEAD: Normocephalic, atraumatic.  CHEST: Normal effort of breathing, regular heart rate ABDOMEN: Soft, nontender, gravid PELVIC: Normal external female genitalia. Vagina is pink and rugated. Cervix with normal contour, no lesions. Normal discharge.  Absent pooling.   Cervical exam:  Dilation: 1 Effacement (%): 60 Station: -2 Presentation: Vertex Exam by:: Dr. Lynnda Shields, MD  Fetal Monitoring: Baseline: 130  Variability: moderate Accelerations: multiple, reactive strip Decelerations: none Contractions: rare  No results found for this or any previous visit (from the past 24 hour(s)).  A: [facility]UP at [redacted]w[redacted]d  Membranes intact  P: -Provided reassurance with strict return precautions for signs/symptosm of term labor, decreased fetal movement, vaginal bleeding. -Plan to f/u with prenatal provider as previously scheduled.  Sheila Oats, MD 07/23/2020 4:24 AM

## 2020-07-23 NOTE — MAU Note (Signed)
...  Meredith Mcclain is a 25 y.o. at [redacted]w[redacted]d here in MAU reporting: I woke up to go to the restroom this morning and when I went to the restroom I noticed my underwear were wet. I wiped myself over and over and it kept coming and it was on my thighs." She also reports when she went back and looked at her bed, there was a wet spot. She is currently experiencing lower back pain during a CTX and rates it 7/10. +FM. No VB.   GBS-

## 2020-08-01 ENCOUNTER — Inpatient Hospital Stay (EMERGENCY_DEPARTMENT_HOSPITAL)
Admission: AD | Admit: 2020-08-01 | Discharge: 2020-08-01 | Disposition: A | Discharge status: Home / Self Care | Payer: PRIVATE HEALTH INSURANCE | Source: Home / Self Care | Attending: Obstetrics | Admitting: Obstetrics

## 2020-08-01 ENCOUNTER — Telehealth (HOSPITAL_COMMUNITY): Payer: Self-pay | Admitting: *Deleted

## 2020-08-01 ENCOUNTER — Encounter (HOSPITAL_COMMUNITY): Payer: Self-pay | Admitting: *Deleted

## 2020-08-01 ENCOUNTER — Encounter (HOSPITAL_COMMUNITY): Payer: Self-pay | Admitting: Obstetrics

## 2020-08-01 DIAGNOSIS — Z3A38 38 weeks gestation of pregnancy: Secondary | ICD-10-CM

## 2020-08-01 DIAGNOSIS — Z0371 Encounter for suspected problem with amniotic cavity and membrane ruled out: Secondary | ICD-10-CM

## 2020-08-01 DIAGNOSIS — O471 False labor at or after 37 completed weeks of gestation: Secondary | ICD-10-CM | POA: Insufficient documentation

## 2020-08-01 LAB — POCT FERN TEST: POCT Fern Test: NEGATIVE

## 2020-08-01 NOTE — MAU Note (Addendum)
PT SAYS SROM-- SAYS LEAKING ALL DAY- STARTED AT 0900.-  PNC WITH DR Chestine Spore-  CALLED OFFICE -TODAY AT 4 PM- TOLD TO COME HERE.  VE IN OFFICE ON Friday  1 CM.  DENIES HSV AND MRSA . GBS- NEG   FEELS SOME UC .

## 2020-08-01 NOTE — MAU Provider Note (Signed)
Event Date/Time   First Provider Initiated Contact with Patient 08/01/20 2133       S: Ms. Meredith Mcclain is a 25 y.o. G2P0010 at [redacted]w[redacted]d  who presents to MAU today complaining of leaking of fluid since this am. She denies vaginal bleeding. She denies contractions. She reports normal fetal movement.    O: BP 128/88 (BP Location: Right Arm)   Pulse 96   Temp 98.2 F (36.8 C)   Resp 20   Ht 5\' 1"  (1.549 m)   Wt 95 kg   LMP 11/05/2019   BMI 39.58 kg/m  GENERAL: Well-developed, well-nourished female in no acute distress.  HEAD: Normocephalic, atraumatic.  CHEST: Normal effort of breathing, regular heart rate ABDOMEN: Soft, nontender, gravid PELVIC: Normal external female genitalia. Vagina is pink and rugated. Cervix with normal contour, no lesions. Normal discharge.  No pooling.   Cervical exam:  Dilation: 1 Effacement (%): Thick Exam by:: [email or web address], CNM   Fetal Monitoring: Baseline: 135 Variability: moderate Accelerations: 15x15, multiple prolonged accels Decelerations: none Contractions: occasional uc's  Fern negative   A: [facility]UP at [redacted]w[redacted]d  Membranes intact  P: -Discharge home in stable condition -Labor precautions discussed -Patient advised to follow-up with [facility] as scheduled for prenatal care -Patient may return to MAU as needed or if her condition were to change or worsen   [facility], Rolm Bookbinder 08/01/2020 9:47 PM

## 2020-08-01 NOTE — Telephone Encounter (Signed)
Preadmission screen  

## 2020-08-01 NOTE — Discharge Instructions (Signed)

## 2020-08-02 ENCOUNTER — Other Ambulatory Visit (HOSPITAL_COMMUNITY): Payer: PRIVATE HEALTH INSURANCE

## 2020-08-04 ENCOUNTER — Encounter (HOSPITAL_COMMUNITY)
Admission: AD | Disposition: A | Discharge status: Home / Self Care | Payer: Self-pay | Source: Home / Self Care | Attending: Obstetrics

## 2020-08-04 ENCOUNTER — Inpatient Hospital Stay (HOSPITAL_COMMUNITY): Payer: PRIVATE HEALTH INSURANCE | Admitting: Certified Registered Nurse Anesthetist

## 2020-08-04 ENCOUNTER — Other Ambulatory Visit: Payer: Self-pay

## 2020-08-04 ENCOUNTER — Encounter (HOSPITAL_COMMUNITY): Payer: Self-pay | Admitting: Obstetrics

## 2020-08-04 ENCOUNTER — Inpatient Hospital Stay (HOSPITAL_COMMUNITY)
Admission: AD | Admit: 2020-08-04 | Discharge: 2020-08-06 | DRG: 788 | Disposition: A | Discharge status: Home / Self Care | Payer: PRIVATE HEALTH INSURANCE | Attending: Obstetrics | Admitting: Obstetrics

## 2020-08-04 ENCOUNTER — Inpatient Hospital Stay (HOSPITAL_COMMUNITY): Payer: PRIVATE HEALTH INSURANCE

## 2020-08-04 DIAGNOSIS — Z20822 Contact with and (suspected) exposure to covid-19: Secondary | ICD-10-CM | POA: Diagnosis present

## 2020-08-04 DIAGNOSIS — O24420 Gestational diabetes mellitus in childbirth, diet controlled: Secondary | ICD-10-CM | POA: Diagnosis present

## 2020-08-04 DIAGNOSIS — Z7722 Contact with and (suspected) exposure to environmental tobacco smoke (acute) (chronic): Secondary | ICD-10-CM | POA: Diagnosis present

## 2020-08-04 DIAGNOSIS — Z3A39 39 weeks gestation of pregnancy: Secondary | ICD-10-CM | POA: Diagnosis not present

## 2020-08-04 DIAGNOSIS — Z98891 History of uterine scar from previous surgery: Secondary | ICD-10-CM

## 2020-08-04 DIAGNOSIS — O99214 Obesity complicating childbirth: Secondary | ICD-10-CM | POA: Diagnosis present

## 2020-08-04 DIAGNOSIS — Z87442 Personal history of urinary calculi: Secondary | ICD-10-CM | POA: Diagnosis not present

## 2020-08-04 DIAGNOSIS — Z3403 Encounter for supervision of normal first pregnancy, third trimester: Secondary | ICD-10-CM

## 2020-08-04 LAB — CBC
HCT: 38.5 % (ref 36.0–46.0)
Hemoglobin: 12.6 g/dL (ref 12.0–15.0)
MCH: 27.6 pg (ref 26.0–34.0)
MCHC: 32.7 g/dL (ref 30.0–36.0)
MCV: 84.4 fL (ref 80.0–100.0)
Platelets: 342 10*3/uL (ref 150–400)
RBC: 4.56 MIL/uL (ref 3.87–5.11)
RDW: 14.6 % (ref 11.5–15.5)
WBC: 11.2 10*3/uL — ABNORMAL HIGH (ref 4.0–10.5)
nRBC: 0 % (ref 0.0–0.2)

## 2020-08-04 LAB — GLUCOSE, CAPILLARY
Glucose-Capillary: 68 mg/dL — ABNORMAL LOW (ref 70–99)
Glucose-Capillary: 72 mg/dL (ref 70–99)
Glucose-Capillary: 84 mg/dL (ref 70–99)

## 2020-08-04 LAB — TYPE AND SCREEN
ABO/RH(D): O POS
Antibody Screen: NEGATIVE

## 2020-08-04 LAB — RESP PANEL BY RT-PCR (FLU A&B, COVID) ARPGX2
Influenza A by PCR: NEGATIVE
Influenza B by PCR: NEGATIVE
SARS Coronavirus 2 by RT PCR: NEGATIVE

## 2020-08-04 SURGERY — Surgical Case
Anesthesia: General

## 2020-08-04 MED ORDER — KETOROLAC TROMETHAMINE 30 MG/ML IJ SOLN
30.0000 mg | Freq: Four times a day (QID) | INTRAMUSCULAR | Status: AC
  Administered 2020-08-05 (×3): 30 mg via INTRAVENOUS
  Filled 2020-08-04 (×3): qty 1

## 2020-08-04 MED ORDER — OXYTOCIN-SODIUM CHLORIDE 30-0.9 UT/500ML-% IV SOLN: 2.5000 [IU]/h | INTRAVENOUS | Status: AC

## 2020-08-04 MED ORDER — WITCH HAZEL-GLYCERIN EX PADS: 1.0000 "application " | MEDICATED_PAD | CUTANEOUS | Status: DC | PRN

## 2020-08-04 MED ORDER — OXYCODONE HCL 5 MG PO TABS: 5.0000 mg | ORAL_TABLET | ORAL | Status: DC | PRN

## 2020-08-04 MED ORDER — ACETAMINOPHEN 10 MG/ML IV SOLN: 1000.0000 mg | Freq: Once | INTRAVENOUS | Status: DC | PRN

## 2020-08-04 MED ORDER — ONDANSETRON HCL 4 MG/2ML IJ SOLN
INTRAMUSCULAR | Status: DC | PRN
  Administered 2020-08-04: 4 mg via INTRAVENOUS

## 2020-08-04 MED ORDER — PROMETHAZINE HCL 25 MG/ML IJ SOLN
6.2500 mg | INTRAMUSCULAR | Status: DC | PRN
Start: 2020-08-04 — End: 2020-08-04
  Administered 2020-08-04: 6.25 mg via INTRAVENOUS

## 2020-08-04 MED ORDER — LIDOCAINE HCL (PF) 1 % IJ SOLN: 30.0000 mL | INTRAMUSCULAR | Status: DC | PRN

## 2020-08-04 MED ORDER — OXYCODONE HCL 5 MG/5ML PO SOLN: 5.0000 mg | Freq: Once | ORAL | Status: DC | PRN

## 2020-08-04 MED ORDER — KETOROLAC TROMETHAMINE 30 MG/ML IJ SOLN: 30.0000 mg | Freq: Four times a day (QID) | INTRAMUSCULAR | Status: AC | PRN

## 2020-08-04 MED ORDER — ACETAMINOPHEN 325 MG PO TABS: 650.0000 mg | ORAL_TABLET | ORAL | Status: DC | PRN

## 2020-08-04 MED ORDER — LACTATED RINGERS IV SOLN: INTRAVENOUS | Status: DC

## 2020-08-04 MED ORDER — HYDROMORPHONE HCL 1 MG/ML IJ SOLN
0.2500 mg | INTRAMUSCULAR | Status: DC | PRN
Start: 2020-08-04 — End: 2020-08-04

## 2020-08-04 MED ORDER — PRENATAL MULTIVITAMIN CH
1.0000 | ORAL_TABLET | Freq: Every day | ORAL | Status: DC
  Administered 2020-08-05: 1 via ORAL
  Filled 2020-08-04: qty 1

## 2020-08-04 MED ORDER — OXYCODONE HCL 5 MG PO TABS: 5.0000 mg | ORAL_TABLET | Freq: Once | ORAL | Status: DC | PRN

## 2020-08-04 MED ORDER — PROMETHAZINE HCL 25 MG/ML IJ SOLN
INTRAMUSCULAR | Status: AC
  Filled 2020-08-04: qty 1

## 2020-08-04 MED ORDER — OXYTOCIN-SODIUM CHLORIDE 30-0.9 UT/500ML-% IV SOLN
2.5000 [IU]/h | INTRAVENOUS | Status: DC
  Filled 2020-08-04: qty 500

## 2020-08-04 MED ORDER — HYDROMORPHONE HCL 1 MG/ML IJ SOLN: 0.2000 mg | INTRAMUSCULAR | Status: DC | PRN

## 2020-08-04 MED ORDER — SCOPOLAMINE 1 MG/3DAYS TD PT72
MEDICATED_PATCH | TRANSDERMAL | Status: AC
  Filled 2020-08-04: qty 1

## 2020-08-04 MED ORDER — TERBUTALINE SULFATE 1 MG/ML IJ SOLN: 0.2500 mg | Freq: Once | INTRAMUSCULAR | Status: DC | PRN

## 2020-08-04 MED ORDER — LIDOCAINE 2% (20 MG/ML) 5 ML SYRINGE
INTRAMUSCULAR | Status: AC
  Filled 2020-08-04: qty 5

## 2020-08-04 MED ORDER — LIDOCAINE HCL (CARDIAC) PF 100 MG/5ML IV SOSY
PREFILLED_SYRINGE | INTRAVENOUS | Status: DC | PRN
  Administered 2020-08-04: 60 mg via INTRAVENOUS

## 2020-08-04 MED ORDER — DEXAMETHASONE SODIUM PHOSPHATE 10 MG/ML IJ SOLN
INTRAMUSCULAR | Status: AC
  Filled 2020-08-04: qty 1

## 2020-08-04 MED ORDER — OXYTOCIN-SODIUM CHLORIDE 30-0.9 UT/500ML-% IV SOLN
INTRAVENOUS | Status: AC
  Filled 2020-08-04: qty 500

## 2020-08-04 MED ORDER — MEPERIDINE HCL 25 MG/ML IJ SOLN
INTRAMUSCULAR | Status: AC
  Filled 2020-08-04: qty 1

## 2020-08-04 MED ORDER — OXYTOCIN-SODIUM CHLORIDE 30-0.9 UT/500ML-% IV SOLN
1.0000 m[IU]/min | INTRAVENOUS | Status: DC
  Administered 2020-08-04: 2 m[IU]/min via INTRAVENOUS

## 2020-08-04 MED ORDER — ONDANSETRON HCL 4 MG/2ML IJ SOLN: 4.0000 mg | Freq: Three times a day (TID) | INTRAMUSCULAR | Status: DC | PRN

## 2020-08-04 MED ORDER — KETOROLAC TROMETHAMINE 30 MG/ML IJ SOLN
30.0000 mg | Freq: Four times a day (QID) | INTRAMUSCULAR | Status: AC | PRN
  Administered 2020-08-04: 30 mg via INTRAVENOUS

## 2020-08-04 MED ORDER — SUCCINYLCHOLINE CHLORIDE 20 MG/ML IJ SOLN
INTRAMUSCULAR | Status: DC | PRN
  Administered 2020-08-04: 180 mg via INTRAVENOUS

## 2020-08-04 MED ORDER — ONDANSETRON HCL 4 MG/2ML IJ SOLN: 4.0000 mg | Freq: Four times a day (QID) | INTRAMUSCULAR | Status: DC | PRN

## 2020-08-04 MED ORDER — FENTANYL CITRATE (PF) 250 MCG/5ML IJ SOLN
INTRAMUSCULAR | Status: AC
  Filled 2020-08-04: qty 5

## 2020-08-04 MED ORDER — DEXAMETHASONE SODIUM PHOSPHATE 10 MG/ML IJ SOLN
INTRAMUSCULAR | Status: DC | PRN
  Administered 2020-08-04: 10 mg via INTRAVENOUS

## 2020-08-04 MED ORDER — OXYCODONE-ACETAMINOPHEN 5-325 MG PO TABS: 2.0000 | ORAL_TABLET | ORAL | Status: DC | PRN

## 2020-08-04 MED ORDER — DIPHENHYDRAMINE HCL 25 MG PO CAPS: 25.0000 mg | ORAL_CAPSULE | Freq: Four times a day (QID) | ORAL | Status: DC | PRN

## 2020-08-04 MED ORDER — ACETAMINOPHEN 500 MG PO TABS
1000.0000 mg | ORAL_TABLET | Freq: Four times a day (QID) | ORAL | Status: DC
  Administered 2020-08-05 – 2020-08-06 (×4): 1000 mg via ORAL
  Filled 2020-08-04 (×6): qty 2

## 2020-08-04 MED ORDER — DIPHENHYDRAMINE HCL 25 MG PO CAPS: 25.0000 mg | ORAL_CAPSULE | ORAL | Status: DC | PRN

## 2020-08-04 MED ORDER — FENTANYL CITRATE (PF) 100 MCG/2ML IJ SOLN
INTRAMUSCULAR | Status: AC
  Filled 2020-08-04: qty 2

## 2020-08-04 MED ORDER — MISOPROSTOL 25 MCG QUARTER TABLET
25.0000 ug | ORAL_TABLET | ORAL | Status: DC | PRN
  Administered 2020-08-04: 25 ug via VAGINAL
  Filled 2020-08-04: qty 1

## 2020-08-04 MED ORDER — PHENYLEPHRINE 40 MCG/ML (10ML) SYRINGE FOR IV PUSH (FOR BLOOD PRESSURE SUPPORT)
PREFILLED_SYRINGE | INTRAVENOUS | Status: AC
  Filled 2020-08-04: qty 10

## 2020-08-04 MED ORDER — FENTANYL CITRATE (PF) 100 MCG/2ML IJ SOLN
50.0000 ug | INTRAMUSCULAR | Status: DC | PRN
  Administered 2020-08-04 (×2): 100 ug via INTRAVENOUS
  Filled 2020-08-04: qty 2

## 2020-08-04 MED ORDER — PROPOFOL 10 MG/ML IV BOLUS
INTRAVENOUS | Status: DC | PRN
  Administered 2020-08-04: 200 mg via INTRAVENOUS

## 2020-08-04 MED ORDER — ALBUMIN HUMAN 5 % IV SOLN: INTRAVENOUS | Status: DC | PRN

## 2020-08-04 MED ORDER — MEPERIDINE HCL 25 MG/ML IJ SOLN
6.2500 mg | INTRAMUSCULAR | Status: AC | PRN
  Administered 2020-08-04 (×2): 6.25 mg via INTRAVENOUS

## 2020-08-04 MED ORDER — CEFAZOLIN SODIUM-DEXTROSE 2-3 GM-%(50ML) IV SOLR
INTRAVENOUS | Status: DC | PRN
  Administered 2020-08-04: 2 g via INTRAVENOUS

## 2020-08-04 MED ORDER — DIBUCAINE (PERIANAL) 1 % EX OINT: 1.0000 "application " | TOPICAL_OINTMENT | CUTANEOUS | Status: DC | PRN

## 2020-08-04 MED ORDER — ACETAMINOPHEN 500 MG PO TABS: 1000.0000 mg | ORAL_TABLET | Freq: Four times a day (QID) | ORAL | Status: DC

## 2020-08-04 MED ORDER — FENTANYL CITRATE (PF) 100 MCG/2ML IJ SOLN
INTRAMUSCULAR | Status: DC | PRN
  Administered 2020-08-04: 250 ug via INTRAVENOUS
  Administered 2020-08-04: 100 ug via INTRAVENOUS

## 2020-08-04 MED ORDER — MENTHOL 3 MG MT LOZG
1.0000 | LOZENGE | OROMUCOSAL | Status: DC | PRN
  Administered 2020-08-05: 3 mg via ORAL
  Filled 2020-08-04: qty 9

## 2020-08-04 MED ORDER — SIMETHICONE 80 MG PO CHEW: 80.0000 mg | CHEWABLE_TABLET | ORAL | Status: DC | PRN

## 2020-08-04 MED ORDER — SOD CITRATE-CITRIC ACID 500-334 MG/5ML PO SOLN: 30.0000 mL | ORAL | Status: DC | PRN

## 2020-08-04 MED ORDER — SUCCINYLCHOLINE CHLORIDE 200 MG/10ML IV SOSY
PREFILLED_SYRINGE | INTRAVENOUS | Status: AC
  Filled 2020-08-04: qty 10

## 2020-08-04 MED ORDER — ONDANSETRON HCL 4 MG/2ML IJ SOLN
INTRAMUSCULAR | Status: AC
  Filled 2020-08-04: qty 2

## 2020-08-04 MED ORDER — ALBUMIN HUMAN 5 % IV SOLN
INTRAVENOUS | Status: AC
  Filled 2020-08-04: qty 250

## 2020-08-04 MED ORDER — ACETAMINOPHEN 10 MG/ML IV SOLN
INTRAVENOUS | Status: DC | PRN
  Administered 2020-08-04: 1000 mg via INTRAVENOUS

## 2020-08-04 MED ORDER — PHENYLEPHRINE HCL (PRESSORS) 10 MG/ML IV SOLN
INTRAVENOUS | Status: DC | PRN
  Administered 2020-08-04 (×2): 40 ug via INTRAVENOUS

## 2020-08-04 MED ORDER — OXYTOCIN-SODIUM CHLORIDE 30-0.9 UT/500ML-% IV SOLN
INTRAVENOUS | Status: DC | PRN
  Administered 2020-08-04: 30 [IU] via INTRAVENOUS

## 2020-08-04 MED ORDER — IBUPROFEN 800 MG PO TABS
800.0000 mg | ORAL_TABLET | Freq: Four times a day (QID) | ORAL | Status: DC
  Administered 2020-08-06: 800 mg via ORAL
  Filled 2020-08-04 (×3): qty 1

## 2020-08-04 MED ORDER — COCONUT OIL OIL: 1.0000 "application " | TOPICAL_OIL | Status: DC | PRN

## 2020-08-04 MED ORDER — PROPOFOL 10 MG/ML IV BOLUS
INTRAVENOUS | Status: AC
  Filled 2020-08-04: qty 20

## 2020-08-04 MED ORDER — SOD CITRATE-CITRIC ACID 500-334 MG/5ML PO SOLN
ORAL | Status: AC
  Administered 2020-08-04: 30 mL via ORAL
  Filled 2020-08-04: qty 15

## 2020-08-04 MED ORDER — KETOROLAC TROMETHAMINE 30 MG/ML IJ SOLN
INTRAMUSCULAR | Status: AC
  Filled 2020-08-04: qty 1

## 2020-08-04 MED ORDER — SCOPOLAMINE 1 MG/3DAYS TD PT72
1.0000 | MEDICATED_PATCH | Freq: Once | TRANSDERMAL | Status: DC
  Administered 2020-08-04: 1.5 mg via TRANSDERMAL

## 2020-08-04 MED ORDER — AMISULPRIDE (ANTIEMETIC) 5 MG/2ML IV SOLN: 10.0000 mg | Freq: Once | INTRAVENOUS | Status: DC | PRN

## 2020-08-04 MED ORDER — LACTATED RINGERS IV SOLN: INTRAVENOUS | Status: DC | PRN

## 2020-08-04 MED ORDER — SIMETHICONE 80 MG PO CHEW
80.0000 mg | CHEWABLE_TABLET | Freq: Three times a day (TID) | ORAL | Status: DC
  Administered 2020-08-05 – 2020-08-06 (×3): 80 mg via ORAL
  Filled 2020-08-04 (×3): qty 1

## 2020-08-04 MED ORDER — OXYTOCIN BOLUS FROM INFUSION: 333.0000 mL | Freq: Once | INTRAVENOUS | Status: DC

## 2020-08-04 MED ORDER — LACTATED RINGERS IV SOLN: 500.0000 mL | INTRAVENOUS | Status: DC | PRN

## 2020-08-04 MED ORDER — SENNOSIDES-DOCUSATE SODIUM 8.6-50 MG PO TABS
2.0000 | ORAL_TABLET | ORAL | Status: DC
  Administered 2020-08-05 – 2020-08-06 (×2): 2 via ORAL
  Filled 2020-08-04 (×2): qty 2

## 2020-08-04 MED ORDER — DIPHENHYDRAMINE HCL 50 MG/ML IJ SOLN: 12.5000 mg | INTRAMUSCULAR | Status: DC | PRN

## 2020-08-04 MED ORDER — ACETAMINOPHEN 10 MG/ML IV SOLN
INTRAVENOUS | Status: AC
  Filled 2020-08-04: qty 100

## 2020-08-04 MED ORDER — OXYCODONE-ACETAMINOPHEN 5-325 MG PO TABS: 1.0000 | ORAL_TABLET | ORAL | Status: DC | PRN

## 2020-08-04 SURGICAL SUPPLY — 38 items
APL SKNCLS STERI-STRIP NONHPOA (GAUZE/BANDAGES/DRESSINGS) ×1
BENZOIN TINCTURE PRP APPL 2/3 (GAUZE/BANDAGES/DRESSINGS) ×2 IMPLANT
CHLORAPREP W/TINT 26ML (MISCELLANEOUS) ×2 IMPLANT
CLAMP CORD UMBIL (MISCELLANEOUS) IMPLANT
CLOTH BEACON ORANGE TIMEOUT ST (SAFETY) ×2 IMPLANT
DRSG OPSITE POSTOP 4X10 (GAUZE/BANDAGES/DRESSINGS) ×2 IMPLANT
ELECT REM PT RETURN 9FT ADLT (ELECTROSURGICAL) ×2
ELECTRODE REM PT RTRN 9FT ADLT (ELECTROSURGICAL) ×1 IMPLANT
EXTRACTOR VACUUM KIWI (MISCELLANEOUS) IMPLANT
GLOVE BIOGEL PI IND STRL 6.5 (GLOVE) ×1 IMPLANT
GLOVE BIOGEL PI IND STRL 7.0 (GLOVE) ×1 IMPLANT
GLOVE BIOGEL PI INDICATOR 6.5 (GLOVE) ×1
GLOVE BIOGEL PI INDICATOR 7.0 (GLOVE) ×1
GLOVE ECLIPSE 6.0 STRL STRAW (GLOVE) ×2 IMPLANT
GOWN STRL REUS W/TWL LRG LVL3 (GOWN DISPOSABLE) ×4 IMPLANT
KIT ABG SYR 3ML LUER SLIP (SYRINGE) IMPLANT
NDL HYPO 25X5/8 SAFETYGLIDE (NEEDLE) IMPLANT
NEEDLE HYPO 25X5/8 SAFETYGLIDE (NEEDLE) IMPLANT
NS IRRIG 1000ML POUR BTL (IV SOLUTION) ×2 IMPLANT
PACK C SECTION WH (CUSTOM PROCEDURE TRAY) ×2 IMPLANT
PAD OB MATERNITY 4.3X12.25 (PERSONAL CARE ITEMS) ×2 IMPLANT
PENCIL SMOKE EVAC W/HOLSTER (ELECTROSURGICAL) ×2 IMPLANT
RTRCTR C-SECT PINK 25CM LRG (MISCELLANEOUS) ×2 IMPLANT
STRIP CLOSURE SKIN 1/2X4 (GAUZE/BANDAGES/DRESSINGS) ×2 IMPLANT
SUT MNCRL 0 VIOLET CTX 36 (SUTURE) ×2 IMPLANT
SUT MONOCRYL 0 CTX 36 (SUTURE) ×4
SUT PLAIN 0 NONE (SUTURE) IMPLANT
SUT PLAIN 2 0 (SUTURE) ×2
SUT PLAIN ABS 2-0 CT1 27XMFL (SUTURE) ×1 IMPLANT
SUT VIC AB 0 CTX 36 (SUTURE) ×4
SUT VIC AB 0 CTX36XBRD ANBCTRL (SUTURE) ×2 IMPLANT
SUT VIC AB 2-0 CT1 (SUTURE) ×1 IMPLANT
SUT VIC AB 2-0 CT1 27 (SUTURE) ×2
SUT VIC AB 2-0 CT1 TAPERPNT 27 (SUTURE) ×1 IMPLANT
SUT VICRYL 4-0 PS2 18IN ABS (SUTURE) ×2 IMPLANT
TOWEL OR 17X24 6PK STRL BLUE (TOWEL DISPOSABLE) ×2 IMPLANT
TRAY FOLEY W/BAG SLVR 14FR LF (SET/KITS/TRAYS/PACK) ×2 IMPLANT
WATER STERILE IRR 1000ML POUR (IV SOLUTION) ×2 IMPLANT

## 2020-08-04 NOTE — Anesthesia Procedure Notes (Signed)
Procedure Name: Intubation Date/Time: 08/04/2020 6:31 PM Performed by: Elgie Congo, CRNA Pre-anesthesia Checklist: Patient identified, Emergency Drugs available, Suction available and Patient being monitored Patient Re-evaluated:Patient Re-evaluated prior to induction Oxygen Delivery Method: Circle system utilized Preoxygenation: Pre-oxygenation with 100% oxygen Induction Type: IV induction, Rapid sequence and Cricoid Pressure applied Laryngoscope Size: Glidescope Grade View: Grade I Tube type: Oral Tube size: 7.0 mm Number of attempts: 1 Airway Equipment and Method: Oral airway and Rigid stylet Placement Confirmation: ETT inserted through vocal cords under direct vision,  positive ETCO2 and breath sounds checked- equal and bilateral Secured at: 22 cm Tube secured with: Tape Dental Injury: Teeth and Oropharynx as per pre-operative assessment

## 2020-08-04 NOTE — Anesthesia Postprocedure Evaluation (Signed)
Anesthesia Post Note  Patient: Meredith Mcclain  Procedure(s) Performed: CESAREAN SECTION (N/A )     Patient location during evaluation: Mother Baby Anesthesia Type: General Level of consciousness: awake and alert Pain management: pain level controlled Vital Signs Assessment: post-procedure vital signs reviewed and stable Respiratory status: spontaneous breathing, nonlabored ventilation and respiratory function stable Cardiovascular status: blood pressure returned to baseline and stable Postop Assessment: no apparent nausea or vomiting Anesthetic complications: no   No complications documented.  Last Vitals:  Vitals:   08/04/20 2100 08/04/20 2115  BP: 126/83 127/74  Pulse: 96 95  Resp: 14 16  Temp:    SpO2: 95% 91%    Last Pain:  Vitals:   08/04/20 2115  TempSrc:   PainSc: 0-No pain   Pain Goal: Patients Stated Pain Goal: 8 (08/04/20 1730)  LLE Motor Response: Purposeful movement (08/04/20 2100) LLE Sensation: Full sensation (08/04/20 2100) RLE Motor Response: Purposeful movement (08/04/20 2100) RLE Sensation: Full sensation (08/04/20 2100)     Epidural/Spinal Function Cutaneous sensation: Normal sensation (08/04/20 2100), Patient able to flex knees: Yes (08/04/20 2100), Patient able to lift hips off bed: Yes (08/04/20 2100), Back pain beyond tenderness at insertion site: No (08/04/20 2100), Progressively worsening motor and/or sensory loss: No (08/04/20 2100), Bowel and/or bladder incontinence post epidural: No (08/04/20 2100)  Mellody Dance

## 2020-08-04 NOTE — H&P (Signed)
25 y.o. G2P0010 @ [redacted]w[redacted]d presents for an elective induction of labor.  Otherwise has good fetal movement and no bleeding.  Pregnancy complicated by: 1. GDMA1.  Most recent growth [place] on 1/7 EFW 7lb 1oz (46%) 2. Obesity: pre-pregnancy BMI 33  Past Medical History:  Diagnosis Date  . Anxiety   . Constipation   . Depression   . Endometriosis   . Kidney stone   . Ovarian cyst   . PONV (postoperative nausea and vomiting)   . TBI (traumatic brain injury) [facility])     Past Surgical History:  Procedure Laterality Date  . APPENDECTOMY    . COLONOSCOPY    . LAPAROSCOPY      OB History  Gravida Para Term Preterm AB Living  2       1 0  SAB IAB Ectopic Multiple Live Births  1            # Outcome Date GA Lbr Len/2nd Weight Sex Delivery Anes PTL Lv  2 Current           1 SAB             Social History   Socioeconomic History  . Marital status: Married    Spouse name: Not on file  . Number of children: Not on file  . Years of education: Not on file  . Highest education level: Not on file  Occupational History  . Not on file  Tobacco Use  . Smoking status: Passive Smoke Exposure - Never Smoker  . Smokeless tobacco: Never Used  Vaping Use  . Vaping Use: Never used  Substance and Sexual Activity  . Alcohol use: Yes  . Drug use: Never  . Sexual activity: Yes  Other Topics Concern  . Not on file  Social History Narrative  . Not on file       Patient has no known allergies.    Prenatal Transfer Tool  Maternal Diabetes: Yes:  Diabetes Type:  Diet controlled Genetic Screening: Normal Maternal Ultrasounds/Referrals: Normal Fetal Ultrasounds or other Referrals:  None Maternal Substance Abuse:  No Significant Maternal Medications:  None Significant Maternal Lab Results: Group B Strep negative  ABO, Rh: --/--/O POS (01/13 1105) Antibody: NEG (01/13 1105) Rubella: Immune (06/28 0000) RPR: Nonreactive (06/28 0000)  HBsAg: Negative (06/28 0000)  HIV: Non-reactive (06/28  0000)  GBS: Negative/-- (12/29 0000)      Vitals:   08/04/20 1051 08/04/20 1227  BP: 136/86   Pulse: (!) 110   Resp: 16 18  Temp: 98.6 F (37 C)      General:  NAD Abdomen:  soft, gravid, EFW 7-7.5# Ex:  trace edema SVE:  1/50/-3/soft/posterior.  Foley bulb placed and filled with 60 mL fluid  FHTs:  130s, moderate variability, + accelerations, category 1 Toco:  quiet   A/P   25 y.o. G2P0010 [redacted]w[redacted]d presents for elective induction of labor IOL--cervical ripening with cytotec and foley bulb GDMA1--q4h BG FSR/ vtx/ GBS negative  Josia Cueva GEFFEL Rodel Glaspy

## 2020-08-04 NOTE — Anesthesia Preprocedure Evaluation (Addendum)
Anesthesia Evaluation  Patient identified by MRN, date of birth, ID band Patient awake    Reviewed: Allergy & Precautions, H&P , NPO status , Patient's Chart, lab work & pertinent test results  History of Anesthesia Complications (+) PONV and history of anesthetic complications  Airway Mallampati: III  TM Distance: >3 FB Neck ROM: Full  Mouth opening: Limited Mouth Opening  Dental no notable dental hx.    Pulmonary neg pulmonary ROS,    Pulmonary exam normal breath sounds clear to auscultation       Cardiovascular negative cardio ROS   Rhythm:Regular Rate:Tachycardia     Neuro/Psych PSYCHIATRIC DISORDERS Anxiety Depression negative neurological ROS     GI/Hepatic negative GI ROS, Neg liver ROS,   Endo/Other  diabetes, GestationalMorbid obesity  Renal/GU Renal disease (kidney stone)  negative genitourinary   Musculoskeletal negative musculoskeletal ROS (+)   Abdominal   Peds negative pediatric ROS (+)  Hematology negative hematology ROS (+)   Anesthesia Other Findings   Reproductive/Obstetrics negative OB ROS                             Anesthesia Physical Anesthesia Plan  ASA: III and emergent  Anesthesia Plan: General   Post-op Pain Management:    Induction: Intravenous  PONV Risk Score and Plan: 4 or greater and Ondansetron, Dexamethasone, Treatment may vary due to age or medical condition, Midazolam and Scopolamine patch - Pre-op  Airway Management Planned: Oral ETT  Additional Equipment:   Intra-op Plan:   Post-operative Plan: Extubation in OR  Informed Consent:     Only emergency history available  Plan Discussed with: Anesthesiologist and CRNA  Anesthesia Plan Comments:        Anesthesia Quick Evaluation

## 2020-08-04 NOTE — Progress Notes (Signed)
Patient seen and examined.  Mild cramping earlier resolved with fentanyl  BP 124/86   Pulse 99   Temp 98.2 F (36.8 C) (Oral)   Resp 18   LMP 11/05/2019  Toco: Irregular contractions EFM: SVE:  Foley bulb sitting in vagina; removed.  5/50/-1/soft/posterior  A/P:  G2P0 at [redacted]w[redacted]d with elective IOL Will start pitocin

## 2020-08-04 NOTE — Progress Notes (Signed)
Patient was examined at 58.  She was noted to be 6/80/-1, AROM moderate clear fluid.  Following AROM, a deceleration to the 50s occurred with the next contraction that resolved with repositioning.  Repeat exam revealed a cord in presenting in front of fetal head. Repositioning on all fours was attempted but was unsuccessful.  Repeat exam revealed persistent prolapsed that was unable to be reduced with gentle manipulation. At this time, the decision was made to proceed with urgent cesarean delivery.  Code cesarean was called.  Verbal consent to proceed with cesarean delivery was provided by the patient.

## 2020-08-04 NOTE — Transfer of Care (Signed)
Immediate Anesthesia Transfer of Care Note  Patient: Meredith Mcclain  Procedure(s) Performed: CESAREAN SECTION (N/A )  Patient Location: PACU  Anesthesia Type:General  Level of Consciousness: awake, alert , oriented and patient cooperative  Airway & Oxygen Therapy: Patient Spontanous Breathing and Patient connected to face mask oxygen  Post-op Assessment: Report given to RN, Post -op Vital signs reviewed and stable and Patient moving all extremities X 4  Post vital signs: Reviewed and stable  Last Vitals:  Vitals Value Taken Time  BP 122/81 08/04/20 1934  Temp    Pulse 136 08/04/20 1943  Resp 24 08/04/20 1943  SpO2 98 % 08/04/20 1943  Vitals shown include unvalidated device data.  Last Pain:  Vitals:   08/04/20 1730  TempSrc: Oral  PainSc: 8       Patients Stated Pain Goal: 8 (08/04/20 1730)  Complications: No complications documented.

## 2020-08-04 NOTE — Lactation Note (Addendum)
This note was copied from a baby's chart. Lactation Consultation Note  Patient Name: Girl Rhya Shan JHERD'E Date: 08/04/2020 Reason for consult: Initial assessment;Term;1st time breastfeeding Age:25 hours P1, term female infant. Mom with hx: GDM, anxiety and depression.  Mom has not latched infant at the breast she  had code C/S. Mom was tired and sleepy from  general anesthesia, infant was given 15 mls of formula less than 2 hours and not interested in feeding at this time( no latch). Per mom, she brought her Medela DEBP from home. LC discussed hand expression and mom expressed 1 ml of colostrum in bullet that she will offer to infant after latching infant at the breast at the next feeding. Mom was in lying position but nipples appear flat, mom will pre-pump breast with hand pump to help evert nipple shaft out more prior to latching infant at the breast. Mom knows to BF infant according to cues, 8 to 12+ times within 24 hours, STS. Mom knows to call RN or LC if she needs assistance with latching infant at the breast. LC discussed input and output with parents. Mom made aware of O/P services, breastfeeding support groups, community resources, and our phone # for post-discharge questions.    Maternal Data Formula Feeding for Exclusion: No Has patient been taught Hand Expression?: Yes Does the patient have breastfeeding experience prior to this delivery?: No  Feeding Feeding Type: Formula  LATCH Score                   Interventions Interventions: Breast feeding basics reviewed;Skin to skin;Breast massage;Hand express;Breast compression;Pre-pump if needed;Hand pump  Lactation Tools Discussed/Used WIC Program: No Pump Education: Setup, frequency, and cleaning;Milk Storage Initiated by:: Danelle Earthly, IBCLC Date initiated:: 08/04/20   Consult Status Consult Status: Follow-up Date: 08/05/20 Follow-up type: In-patient    Danelle Earthly 08/04/2020, 10:14  PM

## 2020-08-04 NOTE — Op Note (Signed)
Cesarean Section Procedure Note  Pre-operative Diagnosis: 1. Intrauterine pregnancy at [redacted]w[redacted]d  2. Prolapsed cord  Post-operative Diagnosis: same as above  Surgeon: Marlow Baars, MD  Assistants: Jen Mow, DO  Procedure: Primary low transverse cesarean section   Anesthesia: General anesthesia  Estimated Blood Loss: 283 mL         Drains: Foley catheter         Specimens: None              Complications:  None; patient tolerated the procedure well.         Disposition: PACU - hemodynamically stable.  Findings:  Normal uterus, tubes and ovaries bilaterally.  Viable female infant, 2840g (6lb 4.2oz) Apgars 7, 9.  Cord gas pH 7.19   Procedure Details   Rupture of membranes occurred at 1820.  Following rupture of membranes, a cord prolapse was noted that was not able to be reduced with gentle manipulation or repositioning.  A code cesarean was called and verbal consent was obtained from the patient.  The patient was taken to the operating room.  She was placed in the dorsal supine position with a leftward tilt, abdomen was prepped with betadine, she was draped and general anesthesia was induced at 1830.  A Pfannenstiel incision was made and carried down through the subcutaneous tissue to the fascia.  The fascia was incised in the midline and the fascial incision was extended bluntly.  The abdomen was entered bluntly.  A bladder blade was placed.  The bladder was identified well below the lower uterine segment. A scalpel was then used to make a low transverse incision on the uterus which was extended in the cephalad-caudad direction with blunt dissection. The fluid was clear. The fetal vertex was identified, elevated out of the pelvis and brought to the hysterotomy.  The head was delivered easily at 1832 followed by the shoulders and body.  The cord was clamped and cut and the infant was passed to the waiting neonatologist.  The placenta was then delivered spontaneously, intact and appear  normal, the uterus was cleared of all clot and debris. A cord gas was obtained.   The hysterotomy was repaired with #0 Monocryl in running locked fashion. A foley catheter was not able to be placed prior to skin incision.  At this time, nursing attempted to place a foley catheter under the sterile field but was unable to do so.  The hysterotomy was examined and excellent hemostasis was noted. The abdominal cavity was cleared of all clot and debris.  The peritoneum was closed with 2-0 vicryl in a running fashion.  The fascia and rectus muscles were inspected and were hemostatic. The fascia was closed with 0 Vicryl in a running fashion. The subcutaneous layer was irrigated and all bleeders cauterized. The subcutaneous layer was closed with interrupted plain gut. The skin was closed with 4-0 vicryl in a subcuticular fashion. The incision was dressed with benzoine, steri strips and honeycomb dressing. All sponge lap and needle counts were correct x3. Patient tolerated the procedure well and recovered in stable condition following the procedure.

## 2020-08-05 ENCOUNTER — Encounter (HOSPITAL_COMMUNITY): Payer: Self-pay | Admitting: Obstetrics

## 2020-08-05 LAB — CBC
HCT: 32.6 % — ABNORMAL LOW (ref 36.0–46.0)
Hemoglobin: 10.6 g/dL — ABNORMAL LOW (ref 12.0–15.0)
MCH: 27.4 pg (ref 26.0–34.0)
MCHC: 32.5 g/dL (ref 30.0–36.0)
MCV: 84.2 fL (ref 80.0–100.0)
Platelets: 292 10*3/uL (ref 150–400)
RBC: 3.87 MIL/uL (ref 3.87–5.11)
RDW: 14.7 % (ref 11.5–15.5)
WBC: 16.2 10*3/uL — ABNORMAL HIGH (ref 4.0–10.5)
nRBC: 0 % (ref 0.0–0.2)

## 2020-08-05 LAB — RPR: RPR Ser Ql: NONREACTIVE

## 2020-08-05 LAB — GLUCOSE, CAPILLARY: Glucose-Capillary: 104 mg/dL — ABNORMAL HIGH (ref 70–99)

## 2020-08-05 NOTE — Lactation Note (Signed)
This note was copied from a baby's chart. Lactation Consultation Note  Patient Name: Meredith Mcclain FXOVA'N Date: 08/05/2020   Age:25 hours  Baby Meredith Meredith Mcclain breastfeeding on arrival.  Baby Meredith breastfeeding on right breast in cradle hold.   She is fussy and coming off and on. After a few minutes she fell asleep.  Assisted with latching her on right breast in cradle hold.  Tcupped nipple and infant stayed better.  Able to hand express drops.  Left dad assisting holding breast trying to keep infant awake.  Discussed initiating pumping with DEBP past breastfeedings.  Night LC to follow up with mom.  Maternal Data    Feeding Feeding Type: Breast Fed  [facility] Score                   Interventions    Lactation Tools Discussed/Used     Consult Status      Neomia Dear 08/05/2020, 6:49 PM

## 2020-08-05 NOTE — Progress Notes (Signed)
CSW received consult for hx of Anxiety and Depression.  CSW met with MOB to offer support and complete assessment.    CSW congratulated MOB on the birth of infant. CSW advised MOB of HIPPA policy in which MOB expressed that she had her mother in the room with her and that it was okay for CSW to speak to her in front of MGM. CSW understanding and then advised MOB of CSW's role. CSW was advised that MOB was never diagnosed with depression but does report that she deals with anxiety. MOB expressed that she  was also never given the diagnosis of anxiety but knows that she just has it. MOB expressed that's he almost past away in a car wreck which aides in her anxiety. MOB expressed that she has been in therapy in the past however declined that she is in therapy now or in need of therapy resources. MOB expressed that she has no other known mental health hx and denies feelings of SI, HI or DV.   CSW inquired from [facility] on who her supports are. MON identified her spouse. MGM, as well as other relatives. MOB expressed that she has all needed items to care for infant with plans for infant to sleep in basinet once arrived home. MOB expressed that she has been feeling sore since giving birth but does report "I am so happy". CSW advised that MOB has no other needs at this time.   CSW provided education regarding the baby blues period vs. perinatal mood disorders, discussed treatment and gave resources for mental health follow up if concerns arise.  CSW recommends self-evaluation during the postpartum time period using the New Mom Checklist from Postpartum Progress and encouraged MOB to contact a medical professional if symptoms are noted at any time.   CSW provided review of Sudden Infant Death Syndrome (SIDS) precautions.   CSW identifies no further need for intervention and no barriers to discharge at this time.   Meredith Mcclain, MSW, LCSW [facility] [place] [phone]

## 2020-08-05 NOTE — Progress Notes (Signed)
Subjective: Postpartum Day 1: Cesarean Delivery Patient reports pain controlled, tolerating PO. No concerns  Objective: Vitals:   08/05/20 0014 08/05/20 0421 08/05/20 0615 08/05/20 0814  BP: 123/77 119/71 118/76 111/72  Pulse: 84 88 76 75  Resp: 16 16  16   Temp: 98.4 F (36.9 C) 98 F (36.7 C)  98.7 F (37.1 C)  TempSrc: Oral Oral  Oral  SpO2: 96% 98%  99%   UOP 75cc/h  Physical Exam:  General: alert Lochia: appropriate Uterine Fundus: firm Incision: dressing clean and dry DVT Evaluation: No evidence of DVT seen on physical exam.  Recent Labs    08/04/20 1100 08/05/20 0537  HGB 12.6 10.6*  HCT 38.5 32.6*    Assessment/Plan: Status post Cesarean section. Doing well postoperatively.  Continue current care. Kenniya Westrich Cisar Enis Gash POD#1 sp CS at [redacted]w[redacted]d 1. PPC: Hgb 12.6>10.6, EBL 283cc 2. GDMA1: POD#1 AM BG 104, plan to recheck tomorrow 3. Vaccine: tdap in pregnancy. Declines COVID, flu vaccines Rubella immune, blood type O POS, bottlefeeding, baby girl in room  Josilyn Shippee K Taam-Akelman 08/05/2020, 8:44 AM

## 2020-08-05 NOTE — Lactation Note (Signed)
This note was copied from a baby's chart. Lactation Consultation Note  Patient Name: Meredith Mcclain [id number] Date: 08/05/2020   Age:25 hours Infant in crib.  Mom resting.  Last feedings have been attempts.  Urged mom to call for feeding assist when infant started cuing.  Mom brought her own personal breastpump.  Tried it for a few minutes.  Didnt get anything.  Mom with no breastfeeding education.  Reviewed Understanding Mother and baby. Explained not getting anything with pumping is normal. Discussed pumping past breastfeeds and/or attempted breastfeeds starting this pm.  Maternal Data    Feeding    LATCH Score                   Interventions    Lactation Tools Discussed/Used     Consult Status      [name] 08/05/2020, 4:57 PM

## 2020-08-06 ENCOUNTER — Encounter (HOSPITAL_COMMUNITY): Payer: Self-pay

## 2020-08-06 DIAGNOSIS — Z98891 History of uterine scar from previous surgery: Secondary | ICD-10-CM

## 2020-08-06 MED ORDER — SENNOSIDES-DOCUSATE SODIUM 8.6-50 MG PO TABS: 2.0000 | ORAL_TABLET | ORAL | 1 refills | Status: DC

## 2020-08-06 MED ORDER — OXYCODONE-ACETAMINOPHEN 5-325 MG PO TABS: 1.0000 | ORAL_TABLET | Freq: Four times a day (QID) | ORAL | 0 refills | Status: DC | PRN

## 2020-08-06 MED ORDER — IBUPROFEN 800 MG PO TABS: 800.0000 mg | ORAL_TABLET | Freq: Four times a day (QID) | ORAL | 0 refills | Status: DC

## 2020-08-06 NOTE — Discharge Summary (Signed)
Postpartum Discharge Summary  Patient Name: Meredith Mcclain DOB: 1996-02-12 MRN: [id number]  Date of admission: 08/04/2020 Delivery date:08/04/2020  Delivering provider: Jerelyn Charles  Date of discharge: 08/06/2020  Admitting diagnosis: Encounter for supervision of normal first pregnancy in third trimester [Z34.03] Intrauterine pregnancy: [redacted]w[redacted]d    Secondary diagnosis:  Active Problems:   Encounter for supervision of normal first pregnancy in third trimester   S/P emergency cesarean section  Additional problems: None    Discharge diagnosis: Term Pregnancy Delivered and GDM A1          Post partum procedures:none Augmentation: AROM, Pitocin and Cytotec Complications: None  Hospital course: Induction of Labor With Cesarean Section   25y.o. yo G2P0010 at 330w0das admitted to the hospital 08/04/2020 for induction of labor 2/2 term, GDMA1. Patient had a labor course significant for prolapsed cord at time of AROM. The patient went for cesarean section due to Cord Prolapse. Delivery details are as follows: Membrane Rupture Time/Date: 6:19 PM ,08/04/2020   Delivery Method:C-Section, Low Transverse  Details of operation can be found in separate operative Note.  Patient had an uncomplicated postpartum course. She is ambulating, tolerating a regular diet, passing flatus, and urinating well.  Patient is discharged home in stable condition on 08/06/20.      Newborn Data: Birth date:08/04/2020  Birth time:6:32 PM  Gender:Female  Living status:Living  Apgars:7 ,9  Weight:2840 g                                 Magnesium Sulfate received: No BMZ received: No Rhophylac:N/A MMR:N/A T-DaP:Given prenatally Flu: No Transfusion:No  Physical exam  Vitals:   08/05/20 0814 08/05/20 1400 08/05/20 2040 08/05/20 2350  BP: 111/72 107/78 114/74   Pulse: 75 74 91   Resp: '16 16 17   ' Temp: 98.7 F (37.1 C) 98.2 F (36.8 C) 98.8 F (37.1 C) 98.1 F (36.7 C)  TempSrc: Oral  Oral Axillary  SpO2:  99% 99% 99%    General: alert Lochia: appropriate Uterine Fundus: firm Incision: dressing clean and dry DVT Evaluation: No evidence of DVT seen on physical exam. Labs: Lab Results  Component Value Date   WBC 16.2 (H) 08/05/2020   HGB 10.6 (L) 08/05/2020   HCT 32.6 (L) 08/05/2020   MCV 84.2 08/05/2020   PLT 292 08/05/2020   CMP Latest Ref Rng & Units 08/19/2015  Glucose 65 - 99 mg/dL 81  BUN 6 - 20 mg/dL 11  Creatinine 0.44 - 1.00 mg/dL 0.51  Sodium 135 - 145 mmol/L 140  Potassium 3.5 - 5.1 mmol/L 3.6  Chloride 101 - 111 mmol/L 103  CO2 22 - 32 mmol/L 27  Calcium 8.9 - 10.3 mg/dL 9.3  Total Protein 6.0 - 8.3 g/dL -  Total Bilirubin 0.3 - 1.2 mg/dL -  Alkaline Phos 47 - 119 U/L -  AST 0 - 37 U/L -  ALT 0 - 35 U/L -   Edinburgh Score: Edinburgh Postnatal Depression Scale Screening Tool 08/05/2020  I have been able to laugh and see the funny side of things. 0  I have looked forward with enjoyment to things. 0  I have blamed myself unnecessarily when things went wrong. 1  I have been anxious or worried for no good reason. 1  I have felt scared or panicky for no good reason. 0  Things have been getting on top of me. 1  I  have been so unhappy that I have had difficulty sleeping. 0  I have felt sad or miserable. 0  I have been so unhappy that I have been crying. 0  The thought of harming myself has occurred to me. 0  Edinburgh Postnatal Depression Scale Total 3      After visit meds:  Allergies as of 08/06/2020   No Known Allergies     Medication List    TAKE these medications   aspirin EC 81 MG tablet Take 81 mg by mouth daily. Swallow whole.   ibuprofen 800 MG tablet Commonly known as: ADVIL Take 1 tablet (800 mg total) by mouth every 6 (six) hours.   MULTIVITAMIN ADULT PO Microgestin 1/20 (21) 1 mg-20 mcg tablet   multivitamin-prenatal 27-0.8 MG Tabs tablet Take 1 tablet by mouth daily at 12 noon.   oxyCODONE-acetaminophen 5-325 MG tablet Commonly known  as: Percocet Take 1 tablet by mouth every 6 (six) hours as needed for severe pain.   phenazopyridine 200 MG tablet Commonly known as: PYRIDIUM Take 1 tablet (200 mg total) by mouth 3 (three) times daily.   senna-docusate 8.6-50 MG tablet Commonly known as: Senokot-S Take 2 tablets by mouth daily.        Discharge home in stable condition Infant Feeding: Bottle Infant Disposition:home with mother Discharge instruction: per After Visit Summary and Postpartum booklet. Activity: Advance as tolerated. Pelvic rest for 6 weeks.  Diet: routine diet Anticipated Birth Control: Unsure Postpartum Appointment:4 weeks Additional Postpartum F/U: 2 hour GTT Future Appointments:No future appointments. Follow up Visit:  Follow-up Information    Jerelyn Charles, MD. Schedule an appointment as soon as possible for a visit in 4 week(s).   Specialty: Obstetrics Why: Recommend a 2 hour diabetes screen postpartum as well. If you would like to do this at your postpartum visit please let the schedulers know. Contact information: [address] [place] [address] [phone]                   08/06/2020 Jonelle Sidle, MD

## 2020-08-06 NOTE — Progress Notes (Addendum)
Subjective: Postpartum Day 2: Cesarean Delivery Patient reports pain controlled, tolerating PO. No concerns, desires discharge today  Objective: Vitals:   08/05/20 0814 08/05/20 1400 08/05/20 2040 08/05/20 2350  BP: 111/72 107/78 114/74   Pulse: 75 74 91   Resp: 16 16 17    Temp: 98.7 F (37.1 C) 98.2 F (36.8 C) 98.8 F (37.1 C) 98.1 F (36.7 C)  TempSrc: Oral  Oral Axillary  SpO2: 99% 99% 99%     Physical Exam:  General: alert Lochia: appropriate Uterine Fundus: firm Incision: dressing clean and dry DVT Evaluation: No evidence of DVT seen on physical exam.  Recent Labs    08/04/20 1100 08/05/20 0537  HGB 12.6 10.6*  HCT 38.5 32.6*    Assessment/Plan: Status post Cesarean section. Doing well postoperatively. Discharge home [facility]CE Cloretta Ned POD#2 sp CS at [redacted]w[redacted]d 1. PPC: Hgb 12.6>10.6, EBL 283cc 2. GDMA1: POD#1 AM BG 104, fasting not collected this AM. Plan for 2h gtt postpartum 3. Vaccine: tdap in pregnancy. Declines COVID, flu vaccines Rubella immune, blood type O POS, bottlefeeding, baby girl in room  Melanye Hiraldo K Taam-Akelman 08/06/2020, 8:41 AM

## 2020-08-06 NOTE — Discharge Instructions (Signed)
Prescriptions Motrin 800mg  every 8 hours for pain Acetaminophen 650mg  every 6 hours for moderate pain Percocet (Oxycodone 5mg -acetaminophen 325mg ) every 4 hours for severe pain.  Make sure to not exceed acetaminophen 3000mg  every day.  Postpartum Care After Cesarean Delivery This sheet gives you information about how to care for yourself from the time you deliver your baby to up to 6-12 weeks after delivery (postpartum period). Your health care provider may also give you more specific instructions. If you have problems or questions, contact your health care provider. Follow these instructions at home: Medicines  Take over-the-counter and prescription medicines only as told by your health care provider.  If you were prescribed an antibiotic medicine, take it as told by your health care provider. Do not stop taking the antibiotic even if you start to feel better.  Ask your health care provider if the medicine prescribed to you: ? Requires you to avoid driving or using heavy machinery. ? Can cause constipation. You may need to take actions to prevent or treat constipation, such as:  Drink enough fluid to keep your urine pale yellow.  Take over-the-counter or prescription medicines.  Eat foods that are high in fiber, such as beans, whole grains, and fresh fruits and vegetables.  Limit foods that are high in fat and processed sugars, such as fried or sweet foods. Activity  Gradually return to your normal activities as told by your health care provider.  Avoid activities that take a lot of effort and energy (are strenuous) until approved by your health care provider. Walking at a slow to moderate pace is usually safe. Ask your health care provider what activities are safe for you. ? Do not lift anything that is heavier than your baby or 10 lb (4.5 kg) as told by your health care provider. ? Do not vacuum, climb stairs, or drive a car for as long as told by your health care provider.  If  possible, have someone help you at home until you are able to do your usual activities yourself.  Rest as much as possible. Try to rest or take naps while your baby is sleeping. Vaginal bleeding  It is normal to have vaginal bleeding (lochia) after delivery. Wear a sanitary pad to absorb vaginal bleeding and discharge. ? During the first week after delivery, the amount and appearance of lochia is often similar to a menstrual period. ? Over the next few weeks, it will gradually decrease to a dry, yellow-brown discharge. ? For most women, lochia stops completely by 4-6 weeks after delivery. Vaginal bleeding can vary from woman to woman.  Change your sanitary pads frequently. Watch for any changes in your flow, such as: ? A sudden increase in volume. ? A change in color. ? Large blood clots.  If you pass a blood clot, save it and call your health care provider to discuss. Do not flush blood clots down the toilet before you get instructions from your health care provider.  Do not use tampons or douches until your health care provider says this is safe.  If you are not breastfeeding, your period should return 6-8 weeks after delivery. If you are breastfeeding, your period may return anytime between 8 weeks after delivery and the time that you stop breastfeeding. Perineal care  If your C-section (Cesarean section) was unplanned, and you were allowed to labor and push before delivery, you may have pain, swelling, and discomfort of the tissue between your vaginal opening and your anus (perineum). You may  also have an incision in the tissue (episiotomy) or the tissue may have torn during delivery. Follow these instructions as told by your health care provider: ? Keep your perineum clean and dry as told by your health care provider. Use medicated pads and pain-relieving sprays and creams as directed. ? If you have an episiotomy or vaginal tear, check the area every day for signs of infection. Check  for:  Redness, swelling, or pain.  Fluid or blood.  Warmth.  Pus or a bad smell. ? You may be given a squirt bottle to use instead of wiping to clean the perineum area after you go to the bathroom. As you start healing, you may use the squirt bottle before wiping yourself. Make sure to wipe gently. ? To relieve pain caused by an episiotomy, vaginal tear, or hemorrhoids, try taking a warm sitz bath 2-3 times a day. A sitz bath is a warm water bath that is taken while you are sitting down. The water should only come up to your hips and should cover your buttocks.   Breast care  Within the first few days after delivery, your breasts may feel heavy, full, and uncomfortable (breast engorgement). You may also have milk leaking from your breasts. Your health care provider can suggest ways to help relieve breast discomfort. Breast engorgement should go away within a few days.  If you are breastfeeding: ? Wear a bra that supports your breasts and fits you well. ? Keep your nipples clean and dry. Apply creams and ointments as told by your health care provider. ? You may need to use breast pads to absorb milk leakage. ? You may have uterine contractions every time you breastfeed for several weeks after delivery. Uterine contractions help your uterus return to its normal size. ? If you have any problems with breastfeeding, work with your health care provider or a Advertising copywriter.  If you are not breastfeeding: ? Avoid touching your breasts as this can make your breasts produce more milk. ? Wear a well-fitting bra and use cold packs to help with swelling. ? Do not squeeze out (express) milk. This causes you to make more milk. Intimacy and sexuality  Ask your health care provider when you can engage in sexual activity. This may depend on your: ? Risk of infection. ? Healing rate. ? Comfort and desire to engage in sexual activity.  You are able to get pregnant after delivery, even if you have  not had your period. If desired, talk with your health care provider about methods of family planning or birth control (contraception). Lifestyle  Do not use any products that contain nicotine or tobacco, such as cigarettes, e-cigarettes, and chewing tobacco. If you need help quitting, ask your health care provider.  Do not drink alcohol, especially if you are breastfeeding. Eating and drinking  Drink enough fluid to keep your urine pale yellow.  Eat high-fiber foods every day. These may help prevent or relieve constipation. High-fiber foods include: ? Whole grain cereals and breads. ? Brown rice. ? Beans. ? Fresh fruits and vegetables.  Take your prenatal vitamins until your postpartum checkup or until your health care provider tells you it is okay to stop.   General instructions  Keep all follow-up visits for you and your baby as told by your health care provider. Most women visit their health care provider for a postpartum checkup within the first 3-6 weeks after delivery. Contact a health care provider if you:  Feel unable to cope  with the changes that a new baby brings to your life, and these feelings do not go away.  Feel unusually sad or worried.  Have breasts that are painful, hard, or turn red.  Have a fever.  Have trouble holding urine or keeping urine from leaking.  Have little or no interest in activities you used to enjoy.  Have not breastfed at all and you have not had a menstrual period for 12 weeks after delivery.  Have stopped breastfeeding and you have not had a menstrual period for 12 weeks after you stopped breastfeeding.  Have questions about caring for yourself or your baby.  Pass a blood clot from your vagina. Get help right away if you:  Have chest pain.  Have difficulty breathing.  Have sudden, severe leg pain.  Have severe pain or cramping in your abdomen.  Bleed from your vagina so much that you fill more than one sanitary pad in one hour.  Bleeding should not be heavier than your heaviest period.  Develop a severe headache.  Faint.  Have blurred vision or spots in your vision.  Have a bad-smelling vaginal discharge.  Have thoughts about hurting yourself or your baby. If you ever feel like you may hurt yourself or others, or have thoughts about taking your own life, get help right away. You can go to your nearest emergency department or call:  Your local emergency services (911 in the [place]  A suicide crisis helpline, such as the National Suicide Prevention Lifeline at 905 864 2291. This is open 24 hours a day. Summary  The period of time from when you deliver your baby to up to 6-12 weeks after delivery is called the postpartum period.  Gradually return to your normal activities as told by your health care provider.  Keep all follow-up visits for you and your baby as told by your health care provider. This information is not intended to replace advice given to you by your health care provider. Make sure you discuss any questions you have with your health care provider. Document Revised: 02/26/2018 Document Reviewed: 02/26/2018 Elsevier Patient Education  2021 [employer].

## 2021-05-23 IMAGING — US ULTRASOUND LEFT BREAST LIMITED
1 series · 3 of 3 positions shown · non-contrast
Comparison: None.

CLINICAL DATA: 23-year-old female with pain in the left breast at
the approximate 6 to 9 o'clock position.

EXAM:
ULTRASOUND OF THE LEFT BREAST

[Series 1: ultrasound left breast limited · 0.08mm/px · 3 of 3 slices shown]
[im 1/3]
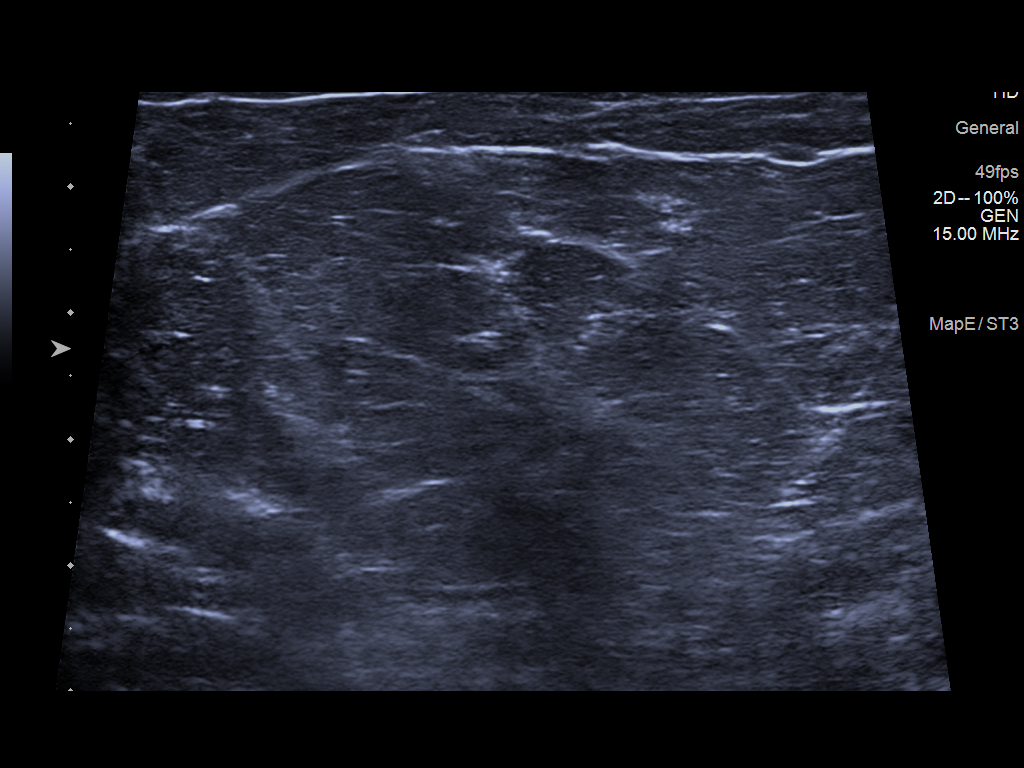
[im 2/3]
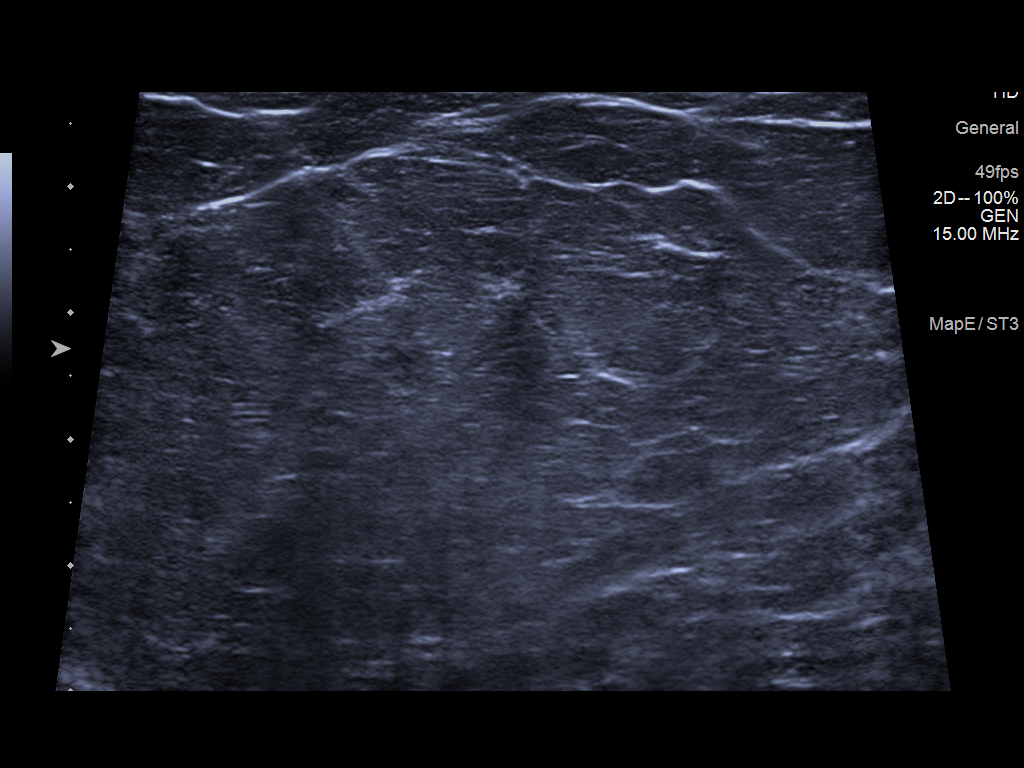
[im 3/3]
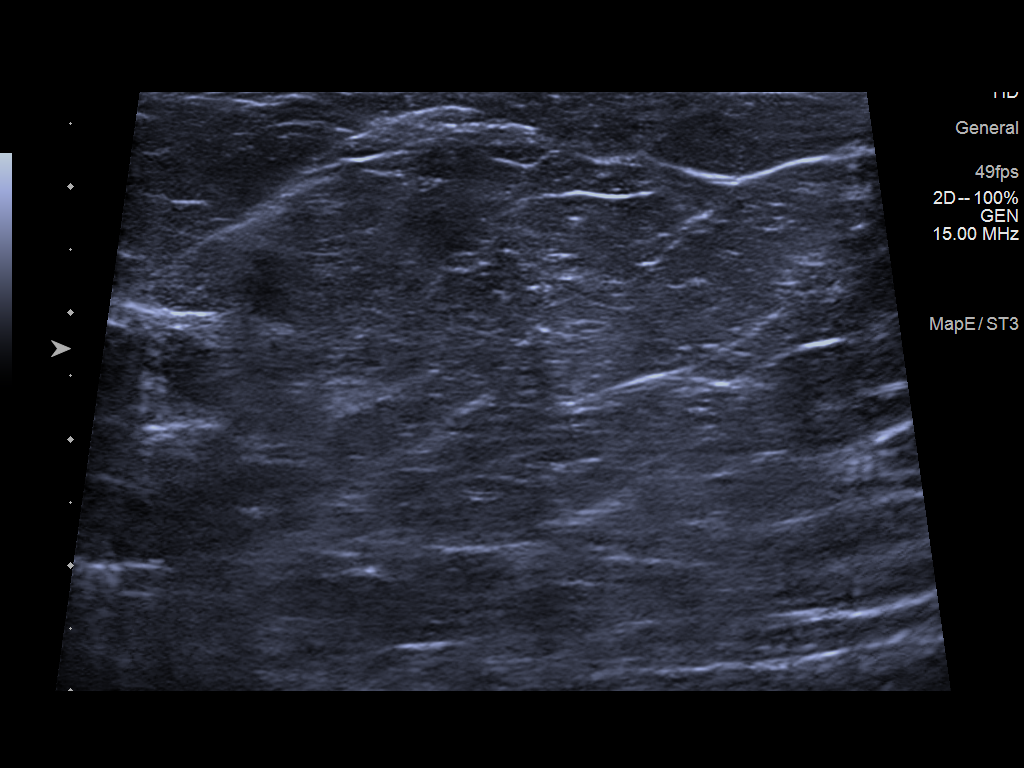

[3 of 3 positions shown; findings below may reference images not displayed]

FINDINGS: Physical examination of the left breast in the region of pain at the
approximate 8 to 9 o'clock position does not reveal any palpable
masses. The patient is mildly tender to deep palpation.

Targeted ultrasound of the left breast was performed. No suspicious
masses or abnormality seen, only heterogeneous fibroglandular tissue
identified.
IMPRESSION: No sonographic abnormalities at site of pain in the left breast.

RECOMMENDATION:
1. Recommend further management of left breast pain be based on
clinical assessment.

2. Screening mammogram at age 40 unless there are persistent or
intervening clinical concerns. (Code:UI-7-60U)

I have discussed the findings and recommendations with the patient.
Results were also provided in writing at the conclusion of the
visit. If applicable, a reminder letter will be sent to the patient
regarding the next appointment.

BI-RADS CATEGORY  1: Negative.

## 2021-10-10 ENCOUNTER — Other Ambulatory Visit (HOSPITAL_COMMUNITY): Payer: Self-pay | Admitting: Family Medicine

## 2021-10-10 DIAGNOSIS — R202 Paresthesia of skin: Secondary | ICD-10-CM

## 2023-05-22 ENCOUNTER — Encounter (HOSPITAL_BASED_OUTPATIENT_CLINIC_OR_DEPARTMENT_OTHER): Payer: Self-pay

## 2023-05-22 ENCOUNTER — Ambulatory Visit (HOSPITAL_BASED_OUTPATIENT_CLINIC_OR_DEPARTMENT_OTHER): Admit: 2023-05-22 | Payer: PRIVATE HEALTH INSURANCE | Admitting: Obstetrics

## 2023-05-22 SURGERY — SALPINGECTOMY, BILATERAL, LAPAROSCOPIC
Anesthesia: General | Laterality: Bilateral

## 2023-06-05 ENCOUNTER — Other Ambulatory Visit (HOSPITAL_COMMUNITY): Payer: Self-pay | Admitting: Family Medicine

## 2023-06-05 DIAGNOSIS — R109 Unspecified abdominal pain: Secondary | ICD-10-CM

## 2023-06-07 ENCOUNTER — Ambulatory Visit (HOSPITAL_BASED_OUTPATIENT_CLINIC_OR_DEPARTMENT_OTHER)
Admission: RE | Admit: 2023-06-07 | Discharge: 2023-06-07 | Disposition: A | Discharge status: Home / Self Care | Payer: No Typology Code available for payment source | Source: Ambulatory Visit | Attending: Family Medicine | Admitting: Family Medicine

## 2023-06-07 ENCOUNTER — Ambulatory Visit (HOSPITAL_BASED_OUTPATIENT_CLINIC_OR_DEPARTMENT_OTHER): Admit: 2023-06-07 | Payer: PRIVATE HEALTH INSURANCE | Admitting: Obstetrics

## 2023-06-07 ENCOUNTER — Encounter (HOSPITAL_BASED_OUTPATIENT_CLINIC_OR_DEPARTMENT_OTHER): Payer: Self-pay

## 2023-06-07 DIAGNOSIS — Z3009 Encounter for other general counseling and advice on contraception: Secondary | ICD-10-CM

## 2023-06-07 DIAGNOSIS — R109 Unspecified abdominal pain: Secondary | ICD-10-CM | POA: Diagnosis present

## 2023-06-07 SURGERY — SALPINGECTOMY, BILATERAL, LAPAROSCOPIC
Anesthesia: General | Laterality: Bilateral

## 2023-06-10 ENCOUNTER — Other Ambulatory Visit: Payer: Self-pay

## 2023-06-10 ENCOUNTER — Emergency Department (HOSPITAL_BASED_OUTPATIENT_CLINIC_OR_DEPARTMENT_OTHER)
Admission: EM | Admit: 2023-06-10 | Discharge: 2023-06-10 | Disposition: A | Discharge status: Home / Self Care | Payer: 59 | Attending: Emergency Medicine | Admitting: Emergency Medicine

## 2023-06-10 ENCOUNTER — Encounter (HOSPITAL_BASED_OUTPATIENT_CLINIC_OR_DEPARTMENT_OTHER): Payer: Self-pay | Admitting: Urology

## 2023-06-10 DIAGNOSIS — R1084 Generalized abdominal pain: Secondary | ICD-10-CM

## 2023-06-10 DIAGNOSIS — Z87442 Personal history of urinary calculi: Secondary | ICD-10-CM | POA: Diagnosis not present

## 2023-06-10 DIAGNOSIS — Z7982 Long term (current) use of aspirin: Secondary | ICD-10-CM | POA: Insufficient documentation

## 2023-06-10 DIAGNOSIS — R1011 Right upper quadrant pain: Secondary | ICD-10-CM | POA: Diagnosis present

## 2023-06-10 LAB — URINALYSIS, ROUTINE W REFLEX MICROSCOPIC
Bilirubin Urine: NEGATIVE
Glucose, UA: NEGATIVE mg/dL
Ketones, ur: NEGATIVE mg/dL
Leukocytes,Ua: NEGATIVE
Nitrite: NEGATIVE
Protein, ur: NEGATIVE mg/dL
Specific Gravity, Urine: 1.025 (ref 1.005–1.030)
pH: 5 (ref 5.0–8.0)

## 2023-06-10 LAB — COMPREHENSIVE METABOLIC PANEL
ALT: 30 U/L (ref 0–44)
AST: 24 U/L (ref 15–41)
Albumin: 4.1 g/dL (ref 3.5–5.0)
Alkaline Phosphatase: 91 U/L (ref 38–126)
Anion gap: 9 (ref 5–15)
BUN: 10 mg/dL (ref 6–20)
CO2: 26 mmol/L (ref 22–32)
Calcium: 9.2 mg/dL (ref 8.9–10.3)
Chloride: 103 mmol/L (ref 98–111)
Creatinine, Ser: 0.66 mg/dL (ref 0.44–1.00)
GFR, Estimated: >60 mL/min (ref >60)
Glucose, Bld: 100 mg/dL — ABNORMAL HIGH (ref 70–99)
Potassium: 3.7 mmol/L (ref 3.5–5.1)
Sodium: 138 mmol/L (ref 135–145)
Total Bilirubin: 0.6 mg/dL (ref <1.2)
Total Protein: 7.6 g/dL (ref 6.5–8.1)

## 2023-06-10 LAB — CBC
HCT: 41.2 % (ref 36.0–46.0)
Hemoglobin: 13.3 g/dL (ref 12.0–15.0)
MCH: 26.4 pg (ref 26.0–34.0)
MCHC: 32.3 g/dL (ref 30.0–36.0)
MCV: 81.9 fL (ref 80.0–100.0)
Platelets: 456 10*3/uL — ABNORMAL HIGH (ref 150–400)
RBC: 5.03 MIL/uL (ref 3.87–5.11)
RDW: 13.7 % (ref 11.5–15.5)
WBC: 9.9 10*3/uL (ref 4.0–10.5)
nRBC: 0 % (ref 0.0–0.2)

## 2023-06-10 LAB — URINALYSIS, MICROSCOPIC (REFLEX)

## 2023-06-10 LAB — LIPASE, BLOOD: Lipase: 30 U/L (ref 11–51)

## 2023-06-10 LAB — PREGNANCY, URINE: Preg Test, Ur: NEGATIVE

## 2023-06-10 NOTE — ED Notes (Signed)
Pt alert and oriented X 4 at the time of discharge. RR even and unlabored. No acute distress noted. Pt verbalized understanding of discharge instructions as discussed. Pt ambulatory to lobby at time of discharge.

## 2023-06-10 NOTE — ED Triage Notes (Signed)
Pt states for past 2 weeks has had mid abdominal pain radiation to RUQ and nausea  Had CT scan Friday but has not gotten results  States vomit x 1 today

## 2023-06-10 NOTE — ED Provider Notes (Signed)
[facility] Provider Note   CSN: [id number] Arrival date & time: 06/10/23  1125     History  Chief Complaint  Patient presents with   Abdominal Pain    Meredith Mcclain is a 27 y.o. female.  The history is provided by the patient.  Abdominal Pain Patient presents with abdominal pain.  Has had for months now.  States has been worse over the last couple weeks.  Goes to the upper abdomen.  Sometimes right upper quadrant.  Sometimes left upper quadrant.  Sometimes in various parts.  States abdomen feels somewhat distended.  Vomited once.  Has seen PCP.  Reportedly had blood drawn and got CT scan on Friday.  She did not have results yet but it is negative now by radiology read.    Past Medical History:  Diagnosis Date   Anxiety    Constipation    Depression    Endometriosis    Kidney stone    Ovarian cyst    PONV (postoperative nausea and vomiting)    TBI (traumatic brain injury) (HCC)     Home Medications Prior to Admission medications   Medication Sig Start Date End Date Taking? Authorizing Provider  aspirin EC 81 MG tablet Take 81 mg by mouth daily. Swallow whole.    [provider]  ibuprofen (ADVIL) 800 MG tablet Take 1 tablet (800 mg total) by mouth every 6 (six) hours. 08/06/20   Rande Brunt, MD  Multiple Vitamins-Minerals (MULTIVITAMIN ADULT PO) Microgestin 1/20 (21) 1 mg-20 mcg tablet    [provider]  oxyCODONE-acetaminophen (PERCOCET) 5-325 MG tablet Take 1 tablet by mouth every 6 (six) hours as needed for severe pain. 08/06/20   Taam-Akelman, Griselda Miner, MD  phenazopyridine (PYRIDIUM) 200 MG tablet Take 1 tablet (200 mg total) by mouth 3 (three) times daily. Patient not taking: Reported on 07/08/2020 07/31/19   Alvino Chapel [place], PA-C  Prenatal Vit-Fe Fumarate-FA (MULTIVITAMIN-PRENATAL) 27-0.8 MG TABS tablet Take 1 tablet by mouth daily at 12 noon.    [provider]  senna-docusate  (SENOKOT-S) 8.6-50 MG tablet Take 2 tablets by mouth daily. 08/06/20   Rande Brunt, MD      Allergies    Patient has no known allergies.    Review of Systems   Review of Systems  Gastrointestinal:  Positive for abdominal pain.    Physical Exam Updated Vital Signs BP 122/83 (BP Location: Left Arm)   Pulse 97   Temp 98.5 F (36.9 C) (Oral)   Resp 16   Ht 5\' 1"  (1.549 m)   Wt 95 kg   SpO2 97%   BMI 39.57 kg/m  Physical Exam Vitals reviewed.  Cardiovascular:     Rate and Rhythm: Normal rate.  Abdominal:     Tenderness: There is abdominal tenderness.     Hernia: No hernia is present.     Comments: Minimal abdominal tenderness.  No hernias palpated.  Neurological:     Mental Status: She is alert.     ED Results / Procedures / Treatments   Labs (all labs ordered are listed, but only abnormal results are displayed) Labs Reviewed  COMPREHENSIVE METABOLIC PANEL - Abnormal; Notable for the following components:      Result Value   Glucose, Bld 100 (*)    All other components within normal limits  CBC - Abnormal; Notable for the following components:   Platelets 456 (*)    All other components within normal limits  URINALYSIS, ROUTINE W REFLEX MICROSCOPIC - Abnormal; Notable for the following components:   Hgb urine dipstick SMALL (*)    All other components within normal limits  URINALYSIS, MICROSCOPIC (REFLEX) - Abnormal; Notable for the following components:   Bacteria, UA MANY (*)    All other components within normal limits  LIPASE, BLOOD  PREGNANCY, URINE    EKG None  Radiology No results found.  Procedures Procedures    Medications Ordered in ED Medications - No data to display  ED Course/ Medical Decision Making/ A&P                                 Medical Decision Making Amount and/or Complexity of Data Reviewed Labs: ordered.   Patient abdominal pain.  Somewhat acute on chronic.  Has had over the last month and had recent CT scan  that was negative.  Blood work today reassuring.  Differential diagnosis includes mostly nonspecific abdominal pain at this point.  Other causes such as biliary disease felt less likely.  LFTs reassuring and no changes on the CT scan.  Does have some bacteria on the urinalysis but no dysuria.  Do not think UTI explains the symptoms.  Reviewed previous CT scans and noted to have around 10 years ago to negative CT scans.  I think patient benefit from GI follow-up.  Will discharge home and follow-up information given.        Final Clinical Impression(s) / ED Diagnoses Final diagnoses:  Generalized abdominal pain    Rx / DC Orders ED Discharge Orders     None         Benjiman Core, MD 06/10/23 1340

## 2023-06-11 ENCOUNTER — Encounter: Payer: Self-pay | Admitting: Gastroenterology

## 2023-06-11 ENCOUNTER — Ambulatory Visit: Payer: 59 | Admitting: Gastroenterology

## 2023-06-11 VITALS — BP 124/82 | HR 102 | Temp 97.8°F | Ht 61.0 in | Wt 212.2 lb

## 2023-06-11 DIAGNOSIS — R112 Nausea with vomiting, unspecified: Secondary | ICD-10-CM | POA: Diagnosis not present

## 2023-06-11 DIAGNOSIS — R1010 Upper abdominal pain, unspecified: Secondary | ICD-10-CM | POA: Diagnosis not present

## 2023-06-11 DIAGNOSIS — K5900 Constipation, unspecified: Secondary | ICD-10-CM

## 2023-06-11 DIAGNOSIS — R140 Abdominal distension (gaseous): Secondary | ICD-10-CM

## 2023-06-11 DIAGNOSIS — N390 Urinary tract infection, site not specified: Secondary | ICD-10-CM

## 2023-06-11 DIAGNOSIS — N3000 Acute cystitis without hematuria: Secondary | ICD-10-CM

## 2023-06-11 DIAGNOSIS — K219 Gastro-esophageal reflux disease without esophagitis: Secondary | ICD-10-CM | POA: Diagnosis not present

## 2023-06-11 DIAGNOSIS — R1013 Epigastric pain: Secondary | ICD-10-CM

## 2023-06-11 MED ORDER — ESOMEPRAZOLE MAGNESIUM 40 MG PO CPDR
40.0000 mg | DELAYED_RELEASE_CAPSULE | Freq: Every day | ORAL | 2 refills | Status: DC
Start: 2023-06-11 — End: 2023-08-07

## 2023-06-11 MED ORDER — CIPROFLOXACIN HCL 250 MG PO TABS
250.0000 mg | ORAL_TABLET | Freq: Two times a day (BID) | ORAL | 0 refills | Status: AC
Start: 2023-06-11 — End: 2023-06-14

## 2023-06-11 MED ORDER — SUCRALFATE 1 G PO TABS
1.0000 g | ORAL_TABLET | Freq: Three times a day (TID) | ORAL | 0 refills | Status: DC
Start: 2023-06-11 — End: 2023-06-24

## 2023-06-11 NOTE — Progress Notes (Unsigned)
GI Office Note    Referring Provider: Lupita Raider, NP Primary Care Physician:  Lupita Raider, NP  Primary Gastroenterologist: Gerrit Friends.Rourk, MD  Chief Complaint   Chief Complaint  Patient presents with   Nausea    Nausea and vomiting for a couple of weeks. Loose stools a couple of times a day. Now is having problems with constipation. Has not had a bowel movement in 2 days. Lots of bloating and pain in lower stomach and back.     History of Present Illness   Meredith Mcclain is a 27 y.o. female presenting today at the request of Lupita Raider, NP for abdominal pain.  CT A/P 06/07/23: -Liver and gallbladder unremarkable, no biliary ductal dilation -Normal pancreas and spleen -No acute intra-abdominal pelvic pathology -No evidence of bowel obstruction -Intrauterine device present.  Seen at outpatient ED 06/10/2023 for 4 months of abdominal pain.  Pain reportedly worse over the last couple of weeks and primarily in the upper abdomen, sometimes starts in the right upper quadrant and sometimes in the left upper quadrant.  Reportedly feeling distended and had vomited once.  Has seen PCP and had blood drawn and CT scan last week.  No hernias palpated and noted minimal abdominal tenderness on exam.  Labs with platelets elevated at 456, small amount of hematuria and bacteriuria.  Lipase within normal limits.  No elevated LFTs.  Patient discharged home and advised to see GI.   Today: Bowel habits - Has always had issues with her bowels. Up until pregnancy she had severe constipation. Once as a teenager it was month without a BM and was hospitalized. When she got pregnant she was going 2-3 times a day - since then she was been going 2-3 times a day. Bristol 3-5 stools, normally 4-5. Not significantly watery, always formed and soft. Since last Tuesday she has been more constipated than recently. Has not been going like her usual. Has been taking in less orally and has also lost weight but  also has cut back on her sweet tea etc. Has not taken anything otc as she does not want to mess anything up or feel worse. PCP called her in some Zofran. Occasionally brbpr with toilet tissue related to hemorrhoids. Sometimes strains.   Was 217/220 about 2 months ago for weight.  Has not had a BM or vomiting since yesterday morning.   Upper abdominal pain - Having symptoms for the last 2 weeks constantly and having pain in the mid upper abdomen and described as a dull ache and sometimes gets more intense. Unsure if chronic acid reflux. Started tacking famotidine with zyrtec for skin reaction related to anxiety and that has helped with that and has realized her reflux has worsens since then. Before her pregnancy she never knew she had acid reflux. Just recently started nexium otc, takes this now instead of famotidine. Hard to say if this has given any improvement. Nothing makes pain better or worse. Has been only drinking water for a couple months (cut out sweet tea). May have occasional alcohol but does not over use. No tobacco use, no vaping. No marijuana use. Yesterday had some vomiting and was not a lot. Tuesday before last she left work at 1:30 in the afternoon and overall felt bloated and in pain after vomiting (started out tan colored and then just yellow and clear) and laid down for a few hours and when getting up and moving around she had multiple episodes of vomiting. Then just had  nausea on and off. Had some constipation during that time as well. Trying to stay hydrated. No NSAIDs, very rarely - hates to take it. No burning, no dysphagia.  Has had some blood in her urine. And sent off for culture. Has had many UTIs in the past. Has had some lowe back pain bilaterally as well. When she has UTIs in the past she usually had lots of pain. Has taken cipro in the past and bactrim.   Had some anxiety issues the other night and had some shortness of breath related to that.   Mother and grandmother have  had issues with scar tissue causing problems. Mother had bladder issues and grandmother had bowel blockage from adhesions.   Has had emergency appendectomy and exploratory surgery as teenager.   Wt Readings from Last 3 Encounters:  06/11/23 212 lb 3.2 oz (96.3 kg)  06/10/23 209 lb 7 oz (95 kg)  08/01/20 209 lb 8 oz (95 kg)   Current Outpatient Medications  Medication Sig Dispense Refill   aspirin EC 81 MG tablet Take 81 mg by mouth daily. Swallow whole.     buPROPion (WELLBUTRIN XL) 150 MG 24 hr tablet Take 150 mg by mouth daily.     esomeprazole (NEXIUM) 20 MG packet Take 20 mg by mouth daily before breakfast.     ferrous sulfate 325 (65 FE) MG tablet Take 325 mg by mouth daily with breakfast.     ibuprofen (ADVIL) 800 MG tablet Take 1 tablet (800 mg total) by mouth every 6 (six) hours. 30 tablet 0   Multiple Vitamins-Minerals (MULTIVITAMIN ADULT PO) Microgestin 1/20 (21) 1 mg-20 mcg tablet     ondansetron (ZOFRAN-ODT) 8 MG disintegrating tablet ondansetron 8 mg disintegrating tablet  DISSOLVE 1 TABLET IN MOUTH EVERY 8 HOURS AS NEEDED FOR NAUSEA AND VOMITING     Prenatal Vit-Fe Fumarate-FA (MULTIVITAMIN-PRENATAL) 27-0.8 MG TABS tablet Take 1 tablet by mouth daily at 12 noon.     senna-docusate (SENOKOT-S) 8.6-50 MG tablet Take 2 tablets by mouth daily. 30 tablet 1   Turmeric (QC TUMERIC COMPLEX) 500 MG CAPS Take by mouth.     No current facility-administered medications for this visit.    Past Medical History:  Diagnosis Date   Anxiety    Constipation    Depression    Endometriosis    Kidney stone    Ovarian cyst    PONV (postoperative nausea and vomiting)    TBI (traumatic brain injury) (HCC)     Past Surgical History:  Procedure Laterality Date   APPENDECTOMY     CESAREAN SECTION N/A 08/04/2020   Procedure: CESAREAN SECTION;  Surgeon: Marlow Baars, MD;  Location: [facility];  Service: Obstetrics;  Laterality: N/A;   COLONOSCOPY     LAPAROSCOPY      Family History   Problem Relation Age of Onset   Anxiety disorder Mother    Asthma Mother    Hypertension Mother    Miscarriages / [place] Mother    Varicose Veins Mother    Anxiety disorder Maternal Grandmother    Depression Maternal Grandmother    Diabetes Paternal Grandfather     Allergies as of 06/11/2023   (No Known Allergies)    Social History   Socioeconomic History   Marital status: Divorced    Spouse name: Not on file   Number of children: Not on file   Years of education: Not on file   Highest education level: Not on file  Occupational History  Not on file  Tobacco Use   Smoking status: Never    Passive exposure: Yes   Smokeless tobacco: Never  Vaping Use   Vaping status: Never Used  Substance and Sexual Activity   Alcohol use: Yes   Drug use: Never   Sexual activity: Yes  Other Topics Concern   Not on file  Social History Narrative   Not on file   Social Determinants of Health   Financial Resource Strain: Not on file  Food Insecurity: No Food Insecurity (06/20/2020)   Hunger Vital Sign    Worried About Running Out of Food in the Last Year: Never true    Ran Out of Food in the Last Year: Never true  Transportation Needs: Not on file  Physical Activity: Not on file  Stress: Not on file  Social Connections: Not on file  Intimate Partner Violence: Not on file     Review of Systems   Gen: Denies any fever, chills, fatigue, weight loss, lack of appetite.  CV: Denies chest pain, heart palpitations, peripheral edema, syncope.  Resp: Denies shortness of breath at rest or with exertion. Denies wheezing or cough.  GI: see HPI GU : Denies urinary burning, urinary frequency, urinary hesitancy MS: Denies joint pain, muscle weakness, cramps, or limitation of movement.  Derm: Denies rash, itching, dry skin Psych: Denies depression, anxiety, memory loss, and confusion Heme: Denies bruising, bleeding, and enlarged lymph nodes.  Physical Exam   BP 124/82 (BP  Location: Right Arm, Patient Position: Sitting, Cuff Size: Large)   Pulse (!) 102   Temp 97.8 F (36.6 C) (Temporal)   Ht 5\' 1"  (1.549 m)   Wt 212 lb 3.2 oz (96.3 kg)   LMP  (Approximate)   BMI 40.09 kg/m   General:   Alert and oriented. Pleasant and cooperative. Well-nourished and well-developed.  Head:  Normocephalic and atraumatic. Eyes:  Without icterus, sclera clear and conjunctiva pink.  Ears:  Normal auditory acuity. Mouth:  No deformity or lesions, oral mucosa pink.  Lungs:  Clear to auscultation bilaterally. No wheezes, rales, or rhonchi. No distress.  Heart:  S1, S2 present without murmurs appreciated.  Abdomen:  +BS, soft, non-tender and non-distended. No HSM noted. No guarding or rebound. No masses appreciated.  Rectal:  Deferred  Msk:  Symmetrical without gross deformities. Normal posture. Extremities:  Without edema. Neurologic:  Alert and  oriented x4;  grossly normal neurologically. Skin:  Intact without significant lesions or rashes. Psych:  Alert and cooperative. Normal mood and affect.  Assessment   Meredith Mcclain is a 27 y.o. female with a history of *** presenting today with   Upper abdominal pain, ?GERD:  UTI:  Change in bowel habits:   PLAN   *** Increased Nexium to 40 mg once daily.  Sucralfate 1g QID for 1 week.  Cipro 250 mg BID for 3 days GERD diet Consider EGD if no improvement with PPI Follow up in 6 weeks.    Brooke Bonito, MSN, FNP-BC, AGACNP-BC [facility]

## 2023-06-11 NOTE — Patient Instructions (Addendum)
I have sent in an increased dose of Nexium for you.  I want you to take one 40 mg capsule once daily, 30 minutes prior to breakfast.  I have also sent in Sucralfate/Carafate for you to take 1 g 4 times a day for 1 week.  Follow a GERD diet:  Avoid fried, fatty, greasy, spicy, citrus foods. Avoid caffeine and carbonated beverages. Avoid chocolate. Try eating 4-6 small meals a day rather than 3 large meals. Do not eat within 3 hours of laying down. Prop head of bed up on wood or bricks to create a 6 inch incline.  Please monitor your bowel habits and let me know if your constipation worsens or if you need recommendations for anything to try.  I will see you for follow-up in 6 weeks.  It was a pleasure to see you today. I want to create trusting relationships with patients. If you receive a survey regarding your visit,  I greatly appreciate you taking time to fill this out on paper or through your MyChart. I value your feedback.  Brooke Bonito, MSN, FNP-BC, AGACNP-BC [facility]

## 2023-06-12 ENCOUNTER — Encounter: Payer: Self-pay | Admitting: Gastroenterology

## 2023-06-24 ENCOUNTER — Other Ambulatory Visit: Payer: Self-pay | Admitting: Gastroenterology

## 2023-06-24 MED ORDER — SUCRALFATE 1 G PO TABS: 1.0000 g | ORAL_TABLET | Freq: Three times a day (TID) | ORAL | 0 refills | Status: DC

## 2023-07-30 ENCOUNTER — Ambulatory Visit: Payer: 59 | Admitting: Gastroenterology

## 2023-08-07 ENCOUNTER — Encounter: Payer: Self-pay | Admitting: Gastroenterology

## 2023-08-07 ENCOUNTER — Ambulatory Visit: Payer: 59 | Admitting: Gastroenterology

## 2023-08-07 VITALS — BP 130/86 | HR 101 | Temp 97.9°F | Ht 61.0 in | Wt 209.0 lb

## 2023-08-07 DIAGNOSIS — R194 Change in bowel habit: Secondary | ICD-10-CM

## 2023-08-07 DIAGNOSIS — R112 Nausea with vomiting, unspecified: Secondary | ICD-10-CM | POA: Diagnosis not present

## 2023-08-07 DIAGNOSIS — K219 Gastro-esophageal reflux disease without esophagitis: Secondary | ICD-10-CM | POA: Diagnosis not present

## 2023-08-07 DIAGNOSIS — R1013 Epigastric pain: Secondary | ICD-10-CM | POA: Diagnosis not present

## 2023-08-07 DIAGNOSIS — R5383 Other fatigue: Secondary | ICD-10-CM

## 2023-08-07 MED ORDER — ESOMEPRAZOLE MAGNESIUM 40 MG PO CPDR: 40.0000 mg | DELAYED_RELEASE_CAPSULE | Freq: Two times a day (BID) | ORAL | 4 refills | Status: DC

## 2023-08-07 MED ORDER — ONDANSETRON 8 MG PO TBDP: 8.0000 mg | ORAL_TABLET | Freq: Every day | ORAL | 1 refills | Status: AC | PRN

## 2023-08-07 NOTE — Patient Instructions (Signed)
 Please have blood work completed at [facility].  We will call you with results once they have been received. Please allow 3-5 business days for review. 2 locations for [facility] in [place]:              1. [address], [place]              2. [address] [place]   We will schedule you for an upper endoscopy in the near future with Dr. Riley Cheadle.  Please increase your Nexium  to 40 mg twice daily, 30 minutes prior to breakfast and dinner.  I have sent in Carafate  for you to take 1 g 3 times a day for better relief of symptoms.  Continue to follow a GERD diet:  Avoid fried, fatty, greasy, spicy, citrus foods. Avoid caffeine and carbonated beverages. Avoid chocolate. Try eating 4-6 small meals a day rather than 3 large meals. Do not eat within 3 hours of laying down. Prop head of bed up on wood or bricks to create a 6 inch incline.  I will plan to see you for follow-up after your upper endoscopy.  I will be in touch with lab results once I have received them.  Please reach out via MyChart if you have any questions or concerns or worsening symptoms prior to your procedure.  It was a pleasure to see you today. I want to create trusting relationships with patients. If you receive a survey regarding your visit,  I greatly appreciate you taking time to fill this out on paper or through your MyChart. I value your feedback.  Julian Obey, MSN, FNP-BC, AGACNP-BC [facility]

## 2023-08-07 NOTE — Progress Notes (Signed)
 GI Office Note    Referring Provider: Wendi Ham, NP Primary Care Physician:  Wendi Ham, NP Primary Gastroenterologist: Windsor Hatcher.Rourk, MD  Date:  08/07/2023  ID:  Meredith Mcclain, DOB [birth year], MRN [id number]   Chief Complaint   Chief Complaint  Patient presents with   Follow-up    Follow up on GERD, pt states she thinks she has an ulcer. Pt states she is nauseated all the times. She has thrown up multiple times since her last visit   History of Present Illness  Meredith Mcclain is a 28 y.o. female with a history of  anxiety, constipation, depression, and endometriosis presenting today with complaint of nausea, vomiting, ongoing reflux and upper abdominal pain.  CT A/P 06/07/23: -Liver and gallbladder unremarkable, no biliary ductal dilation -Normal pancreas and spleen -No acute intra-abdominal pelvic pathology -No evidence of bowel obstruction -Intrauterine device present.   Seen at outpatient ED 06/10/2023 for 4 months of abdominal pain.  Pain reportedly worse over the last couple of weeks and primarily in the upper abdomen, sometimes starts in the right upper quadrant and sometimes in the left upper quadrant.  Reportedly feeling distended and had vomited once.  Has seen PCP and had blood drawn and CT scan last week.  No hernias palpated and noted minimal abdominal tenderness on exam.  Labs with platelets elevated at 456, small amount of hematuria and bacteriuria.  Lipase within normal limits.  No elevated LFTs.  Patient discharged home and advised to see GI.  Initial office visit 06/08/23.  Patient reported she has had only issues with her bowels.  Had severe constipation up until her pregnancy.  As a teenager she could go up to a month without a bowel movement.  After pregnancy she may be going 2-3 times a day with Bristol 3-5 stools.  Stools are not significantly watery and not always formed and soft.  Recently had not been going like usual.  She did report some recent  weight loss, stating that she was 217/222 months prior.  Has been having upper abdominal pain for 2 weeks constantly with pain in the mid upper abdomen described as dull and achy and sometimes more tense.  She was unsure if this was chronic reflux as she was taking famotidine with Zyrtec for skin irritation and anxiety and notes worsening reflux since then.  She denied any tobacco use or vaping also no marijuana use.  She had cut out sweet tea and only been drinking water recently.  She is trying to stay hydrated.  She noted very rare NSAID use as she hates to take medication.  She had also noted some blood in her urine and some recent frequent UTIs.  She had had some shortness of breath related to anxiety.  She reported emergency laparotomy as a teenager as well as appendectomy. Nexium  increased to 40 mg. Given carafate  for 1 week as well as Cipro  for 3 days. GERD diet advised. Consider EGD if no improvement with PPI. Follow up 6 weeks.    Today:  While on sucralfate  she was feeling alot better. No nausea or vomiting. Pain was there but was improved some. When she ran out of carafate  and had a half a day she ran out before thanksgiving and by Friday after she started having recurrent symptoms and by saturday she had projectile vomiting multiple times and that continued into Sunday and then stopped by here on Monday and she requested the carafate  again. Doing well while on it but  again after running out she had return of symptoms. Gaviscon helps some as well but not like the carafate .    Bowel habits have been normal for her since her pregnancy. No related lower abdominal pain. Sometimes once daily 2-3 times daily unless she is dehydrated.   States when projectile vomiting nothing was stopping the cycle.   Last wednesday was last day of vomiting. Is taking gaviscon everyday recently and trying to eat very bland. Even right now she is feeling nauseas and reserves the zofran  for severe symptoms.  Experiencing the bloating frequently as well.   Over the last year she has been experiencing more fatigue as well.    Wt Readings from Last 3 Encounters:  08/07/23 209 lb (94.8 kg)  06/11/23 212 lb 3.2 oz (96.3 kg)  06/10/23 209 lb 7 oz (95 kg)   Current Outpatient Medications  Medication Sig Dispense Refill   buPROPion (WELLBUTRIN XL) 150 MG 24 hr tablet Take 150 mg by mouth daily.     esomeprazole  (NEXIUM ) 40 MG capsule Take 1 capsule (40 mg total) by mouth daily before breakfast. 30 capsule 2   ferrous sulfate 325 (65 FE) MG tablet Take 325 mg by mouth daily with breakfast.     magnesium  30 MG tablet Take 30 mg by mouth 2 (two) times daily.     Misc Natural Products (ELDERBERRY IMMUNE COMPLEX PO) Take by mouth.     Multiple Vitamins-Minerals (MULTIVITAMIN ADULT PO) Microgestin 1/20 (21) 1 mg-20 mcg tablet     ondansetron  (ZOFRAN -ODT) 8 MG disintegrating tablet ondansetron  8 mg disintegrating tablet  DISSOLVE 1 TABLET IN MOUTH EVERY 8 HOURS AS NEEDED FOR NAUSEA AND VOMITING     aspirin EC 81 MG tablet Take 81 mg by mouth daily. Swallow whole. (Patient not taking: Reported on 08/07/2023)     ibuprofen  (ADVIL ) 800 MG tablet Take 1 tablet (800 mg total) by mouth every 6 (six) hours. (Patient not taking: Reported on 08/07/2023) 30 tablet 0   Prenatal Vit-Fe Fumarate-FA (MULTIVITAMIN-PRENATAL) 27-0.8 MG TABS tablet Take 1 tablet by mouth daily at 12 noon. (Patient not taking: Reported on 08/07/2023)     senna-docusate (SENOKOT-S) 8.6-50 MG tablet Take 2 tablets by mouth daily. (Patient not taking: Reported on 08/07/2023) 30 tablet 1   sucralfate  (CARAFATE ) 1 g tablet Take 1 tablet (1 g total) by mouth 4 (four) times daily -  with meals and at bedtime for 7 days. 28 tablet 0   Turmeric (QC TUMERIC COMPLEX) 500 MG CAPS Take by mouth. (Patient not taking: Reported on 08/07/2023)     No current facility-administered medications for this visit.    Past Medical History:  Diagnosis Date   Anxiety     Constipation    Depression    Endometriosis    Kidney stone    Ovarian cyst    PONV (postoperative nausea and vomiting)    TBI (traumatic brain injury) (HCC)     Past Surgical History:  Procedure Laterality Date   APPENDECTOMY     CESAREAN SECTION N/A 08/04/2020   Procedure: CESAREAN SECTION;  Surgeon: Luan Rumpf, MD;  Location: [facility] LD ORS;  Service: Obstetrics;  Laterality: N/A;   COLONOSCOPY     LAPAROSCOPY      Family History  Problem Relation Age of Onset   Anxiety disorder Mother    Asthma Mother    Hypertension Mother    Miscarriages / [place] Mother    Varicose Veins Mother    Anxiety disorder Maternal Grandmother  Depression Maternal Grandmother    Diabetes Paternal Grandfather     Allergies as of 08/07/2023   (No Known Allergies)    Social History   Socioeconomic History   Marital status: Divorced    Spouse name: Not on file   Number of children: Not on file   Years of education: Not on file   Highest education level: Not on file  Occupational History   Not on file  Tobacco Use   Smoking status: Never    Passive exposure: Yes   Smokeless tobacco: Never  Vaping Use   Vaping status: Never Used  Substance and Sexual Activity   Alcohol use: Yes   Drug use: Never   Sexual activity: Yes  Other Topics Concern   Not on file  Social History Narrative   Not on file   Social Drivers of Health   Financial Resource Strain: Not on file  Food Insecurity: No Food Insecurity (06/20/2020)   Hunger Vital Sign    Worried About Running Out of Food in the Last Year: Never true    Ran Out of Food in the Last Year: Never true  Transportation Needs: Not on file  Physical Activity: Not on file  Stress: Not on file  Social Connections: Not on file    Review of Systems   Gen: Denies fever, chills, anorexia. Denies fatigue, weakness, weight loss.  CV: Denies chest pain, palpitations, syncope, peripheral edema, and claudication. Resp: Denies dyspnea at  rest, cough, wheezing, coughing up blood, and pleurisy. GI: See HPI Derm: Denies rash, itching, dry skin Psych: Denies depression, anxiety, memory loss, confusion. No homicidal or suicidal ideation.  Heme: Denies bruising, bleeding, and enlarged lymph nodes.  Physical Exam   BP 130/86   Pulse (!) 101   Temp 97.9 F (36.6 C)   Ht 5\' 1"  (1.549 m)   Wt 209 lb (94.8 kg)   BMI 39.49 kg/m   General:   Alert and oriented. No distress noted. Pleasant and cooperative.  Head:  Normocephalic and atraumatic. Eyes:  Conjuctiva clear without scleral icterus. Mouth:  Oral mucosa pink and moist. Good dentition. No lesions. Abdomen:  +BS, soft, non-distended.  Mild TTP to RUQ, epigastrium, and LUQ.  No rebound or guarding. No HSM or masses noted. Rectal: deferred Msk:  Symmetrical without gross deformities. Normal posture. Extremities:  Without edema. Neurologic:  Alert and  oriented x4 Psych:  Alert and cooperative. Normal mood and affect.  Assessment  Meredith Mcclain is a 28 y.o. female with a history of anxiety, constipation, depression, and endometriosis presenting today with complaint of nausea, vomiting, ongoing reflux and upper abdominal pain.  GERD, epigastric pain, nausea/vomiting: Continues to experience significant upper abdominal pain, nausea, and occasional vomiting.  Has had a few episodes of intractable vomiting despite many over-the-counter medications including Zofran .  She has previously cut out tea and had only been drinking water other than occasional alcoholic drink.  Continues to have significant bloating feeling as well as intermittent nausea and vomiting as outlined in HPI.  Has had symptomatic improvement with Carafate  previously.  At last office visit we increased her Nexium  to 40 mg once daily and has not noticed a significant improvement.  For now given her symptoms we will increase her Nexium  to 40 mg twice daily and I have refilled Carafate  given the severity of her  symptoms.  Given not much improvement with medication we will proceed with upper endoscopy.  Differentials include H. pylori, esophagitis, gastritis, duodenitis, peptic ulcer  disease, hiatal hernia, etc.  Given her bloating, nausea vomiting, and fatigue as noted below we will also assess for celiac and check for B12 and folate deficiency.  Reassuringly no significant anemia noted on labs in November 2024.  Change in bowel habits: States that she feels like having 2-3 times a day of bowel meds as her new normal.  Continues to deny any lower abdominal pain, melena, or significant BRBPR.  Previous lower abdominal pain suspected be secondary to UTI.   Fatigue: Has been feeling more fatigued recently for the last year.  She has been on iron daily as recommended by PCP given possible history of lower iron saturation in the past.  She has not had any follow-up iron testing since then.  Given fatigue as well as her upper abdominal symptoms we will check CBC, iron panel, B12 and folate as well as celiac labs.  Also given her upper GI symptoms we are proceeding with EGD as noted above.  PLAN   Proceed with upper endoscopy with propofol  by Dr. Riley Cheadle in near future: the risks, benefits, and alternatives have been discussed with the patient in detail. The patient states understanding and desires to proceed. ASA 3   UPT CBC, Iron panel, B12, Folate, Celiac labs Nexium  40 mg BID  Carafate  1g TID GERD diet Follow up post procedure    Julian Obey, MSN, FNP-BC, AGACNP-BC [facility]

## 2023-08-08 ENCOUNTER — Encounter: Payer: Self-pay | Admitting: *Deleted

## 2023-08-08 ENCOUNTER — Telehealth: Payer: Self-pay | Admitting: *Deleted

## 2023-08-08 NOTE — Telephone Encounter (Signed)
[facility]ity PA:  Diagnosis Code 1 R1013 - Epigastric pain  Diagnosis Code 2 K219 - Gastro-esophageal reflux disease without esophagitis  Diagnosis Code 3 R112 - Nausea with vomiting unspecified  Procedure Code 1 36644  Quantity 1 Days  Procedure From - To Date 2023-09-11  Status NO AUTH REQUIRED  Message PRECERT IS NOT REQUIRED OR ONLY REQUIRED IN UNIQUE CIRCUMSTANCES FOR MEDICAID REFER TO PRIOR AUTH TOOL ON [email or web address] FOR ALL OTHERS REFER TO CODE SEARCH TOOL ON [email or web address] COVERAGE OF SERVICES ARE SUBJECT TO BENEFITS AND ELIGIBILITY

## 2023-08-09 ENCOUNTER — Other Ambulatory Visit: Payer: Self-pay | Admitting: Gastroenterology

## 2023-08-12 ENCOUNTER — Other Ambulatory Visit: Payer: Self-pay | Admitting: Gastroenterology

## 2023-08-12 ENCOUNTER — Telehealth: Payer: Self-pay | Admitting: *Deleted

## 2023-08-12 MED ORDER — SUCRALFATE 1 G PO TABS: 1.0000 g | ORAL_TABLET | Freq: Three times a day (TID) | ORAL | 0 refills | Status: DC

## 2023-08-12 NOTE — Telephone Encounter (Signed)
Noted  

## 2023-08-12 NOTE — Telephone Encounter (Signed)
Pt called and states she needs carafate sent to [facility] in [place].

## 2023-08-16 LAB — CBC
Hematocrit: 37.6 % (ref 34.0–46.6)
Hemoglobin: 12.2 g/dL (ref 11.1–15.9)
MCH: 26.8 pg (ref 26.6–33.0)
MCHC: 32.4 g/dL (ref 31.5–35.7)
MCV: 83 fL (ref 79–97)
Platelets: 470 10*3/uL — ABNORMAL HIGH (ref 150–450)
RBC: 4.56 x10E6/uL (ref 3.77–5.28)
RDW: 13.3 % (ref 11.7–15.4)
WBC: 12.4 10*3/uL — ABNORMAL HIGH (ref 3.4–10.8)

## 2023-08-16 LAB — IRON,TIBC AND FERRITIN PANEL
Ferritin: 196 ng/mL — ABNORMAL HIGH (ref 15–150)
Iron Saturation: 8 % — CL (ref 15–55)
Iron: 24 ug/dL — ABNORMAL LOW (ref 27–159)
Total Iron Binding Capacity: 306 ug/dL (ref 250–450)
UIBC: 282 ug/dL (ref 131–425)

## 2023-08-16 LAB — GLIA (IGA/G) + TTG IGA
Antigliadin Abs, IgA: 3 U (ref 0–19)
Gliadin IgG: 3 U (ref 0–19)
Transglutaminase IgA: <2 U/mL (ref 0–3)

## 2023-08-16 LAB — B12 AND FOLATE PANEL
Folate: >20 ng/mL (ref >3.0)
Vitamin B-12: 657 pg/mL (ref 232–1245)

## 2023-08-16 LAB — IGA: IgA/Immunoglobulin A, Serum: 149 mg/dL (ref 87–352)

## 2023-08-29 ENCOUNTER — Other Ambulatory Visit: Payer: Self-pay | Admitting: Gastroenterology

## 2023-08-29 MED ORDER — SUCRALFATE 1 G PO TABS: 1.0000 g | ORAL_TABLET | Freq: Three times a day (TID) | ORAL | 0 refills | Status: DC

## 2023-09-04 NOTE — Patient Instructions (Signed)
20    Your procedure is scheduled on: 09/11/2023  Report to [facility] Main Entrance at    7:30 AM.  Call this number if you have problems the morning of surgery: [phone]   Remember:   Follow instructions on letter from office regarding when to stop eating and drinking        No Smoking the day of procedure      Take these medicines the morning of surgery with A SIP OF WATER: Wellbutrin, Nexium and zofran if needed   Do not wear jewelry, make-up or nail polish.  Do not wear lotions, powders, or perfumes. You may wear deodorant.                Do not bring valuables to the hospital.  Contacts, dentures or bridgework may not be worn into surgery.  Leave suitcase in the car. After surgery it may be brought to your room.  For patients admitted to the hospital, checkout time is 11:00 AM the day of discharge.   Patients discharged the day of surgery will not be allowed to drive home. Upper Endoscopy, Adult Upper endoscopy is a procedure to look inside the upper GI (gastrointestinal) tract. The upper GI tract is made up of: The part of the body that moves food from your mouth to your stomach (esophagus). The stomach. The first part of your small intestine (duodenum). This procedure is also called esophagogastroduodenoscopy (EGD) or gastroscopy. In this procedure, your health care provider passes a thin, flexible tube (endoscope) through your mouth and down your esophagus into your stomach. A small camera is attached to the end of the tube. Images from the camera appear on a monitor in the exam room. During this procedure, your health care provider may also remove a small piece of tissue to be sent to a lab and examined under a microscope (biopsy). Your health care provider may do an upper endoscopy to diagnose cancers of the upper GI tract. You may also have this procedure to find the cause of other conditions, such as: Stomach pain. Heartburn. Pain or problems when swallowing. Nausea and  vomiting. Stomach bleeding. Stomach ulcers. Tell a health care provider about: Any allergies you have. All medicines you are taking, including vitamins, herbs, eye drops, creams, and over-the-counter medicines. Any problems you or family members have had with anesthetic medicines. Any blood disorders you have. Any surgeries you have had. Any medical conditions you have. Whether you are pregnant or may be pregnant. What are the risks? Generally, this is a safe procedure. However, problems may occur, including: Infection. Bleeding. Allergic reactions to medicines. A tear or hole (perforation) in the esophagus, stomach, or duodenum. What happens before the procedure? Staying hydrated Follow instructions from your health care provider about hydration, which may include: Up to 4 hours before the procedure - you may continue to drink clear liquids, such as water, clear fruit juice, black coffee, and plain tea.   Medicines Ask your health care provider about: Changing or stopping your regular medicines. This is especially important if you are taking diabetes medicines or blood thinners. Taking medicines such as aspirin and ibuprofen. These medicines can thin your blood. Do not take these medicines unless your health care provider tells you to take them. Taking over-the-counter medicines, vitamins, herbs, and supplements. General instructions Plan to have someone take you home from the hospital or clinic. If you will be going home right after the procedure, plan to have someone with you for 24  hours. Ask your health care provider what steps will be taken to help prevent infection. What happens during the procedure?  An IV will be inserted into one of your veins. You may be given one or more of the following: A medicine to help you relax (sedative). A medicine to numb the throat (local anesthetic). You will lie on your left side on an exam table. Your health care provider will pass the  endoscope through your mouth and down your esophagus. Your health care provider will use the scope to check the inside of your esophagus, stomach, and duodenum. Biopsies may be taken. The endoscope will be removed. The procedure may vary among health care providers and hospitals. What happens after the procedure? Your blood pressure, heart rate, breathing rate, and blood oxygen level will be monitored until you leave the hospital or clinic. Do not drive for 24 hours if you were given a sedative during your procedure. When your throat is no longer numb, you may be given some fluids to drink. It is up to you to get the results of your procedure. Ask your health care provider, or the department that is doing the procedure, when your results will be ready. Summary Upper endoscopy is a procedure to look inside the upper GI tract. During the procedure, an IV will be inserted into one of your veins. You may be given a medicine to help you relax. A medicine will be used to numb your throat. The endoscope will be passed through your mouth and down your esophagus. This information is not intended to replace advice given to you by your health care provider. Make sure you discuss any questions you have with your health care provider. Document Revised: 01/01/2018 Document Reviewed: 12/09/2017 Elsevier Patient Education  2020 [employer]                                                                                                                                      EndoscopyCare After  Please read the instructions outlined below and refer to this sheet in the next few weeks. These discharge instructions provide you with general information on caring for yourself after you leave the hospital. Your doctor may also give you specific instructions. While your treatment has been planned according to the most current medical practices available, unavoidable complications occasionally occur. If you have any  problems or questions after discharge, please call your doctor. HOME CARE INSTRUCTIONS Activity You may resume your regular activity but move at a slower pace for the next 24 hours.  Take frequent rest periods for the next 24 hours.  Walking will help expel (get rid of) the air and reduce the bloated feeling in your abdomen.  No driving for 24 hours (because of the anesthesia (medicine) used during the test).  You may shower.  Do not sign any important legal documents or operate any machinery for 24 hours (because of  the anesthesia used during the test).  Nutrition Drink plenty of fluids.  You may resume your normal diet.  Begin with a light meal and progress to your normal diet.  Avoid alcoholic beverages for 24 hours or as instructed by your caregiver.  Medications You may resume your normal medications unless your caregiver tells you otherwise. What you can expect today You may experience abdominal discomfort such as a feeling of fullness or "gas" pains.  You may experience a sore throat for 2 to 3 days. This is normal. Gargling with salt water may help this.  Follow-up Your doctor will discuss the results of your test with you. SEEK IMMEDIATE MEDICAL CARE IF: You have excessive nausea (feeling sick to your stomach) and/or vomiting.  You have severe abdominal pain and distention (swelling).  You have trouble swallowing.  You have a temperature over 100 F (37.8 C).  You have rectal bleeding or vomiting of blood.  Document Released: 02/21/2004 Document Revised: 06/28/2011 Document Reviewed: 09/03/2007

## 2023-09-06 ENCOUNTER — Encounter (HOSPITAL_COMMUNITY): Payer: Self-pay

## 2023-09-06 ENCOUNTER — Encounter (HOSPITAL_COMMUNITY)
Admission: RE | Admit: 2023-09-06 | Discharge: 2023-09-06 | Disposition: A | Discharge status: Home / Self Care | Payer: 59 | Source: Ambulatory Visit | Attending: Internal Medicine | Admitting: Internal Medicine

## 2023-09-06 DIAGNOSIS — Z01818 Encounter for other preprocedural examination: Secondary | ICD-10-CM

## 2023-09-06 NOTE — Telephone Encounter (Signed)
Message Received: Today Meredith Gaskins, RN  [name], LPN; [name] Patient no show PAT appointment today. Patient states she wants to cancel her EGD due to cost. Please cancel PAT appointment for today and EGD. Thanks!    Pt had also sent mychart message to provider

## 2023-09-06 NOTE — Telephone Encounter (Signed)
Endo already cancelled.

## 2023-09-11 ENCOUNTER — Ambulatory Visit (HOSPITAL_COMMUNITY): Admission: RE | Admit: 2023-09-11 | Payer: 59 | Source: Home / Self Care | Admitting: Internal Medicine

## 2023-09-11 ENCOUNTER — Encounter (HOSPITAL_COMMUNITY): Admission: RE | Payer: Self-pay | Source: Home / Self Care

## 2023-09-11 SURGERY — ESOPHAGOGASTRODUODENOSCOPY (EGD) WITH PROPOFOL
Anesthesia: Choice

## 2023-11-23 ENCOUNTER — Other Ambulatory Visit: Payer: Self-pay | Admitting: Gastroenterology

## 2023-11-25 ENCOUNTER — Other Ambulatory Visit: Payer: Self-pay | Admitting: Gastroenterology

## 2023-12-24 ENCOUNTER — Other Ambulatory Visit: Payer: Self-pay | Admitting: Gastroenterology

## 2023-12-24 MED ORDER — SUCRALFATE 1 G PO TABS: 1.0000 g | ORAL_TABLET | Freq: Three times a day (TID) | ORAL | 1 refills | Status: DC

## 2023-12-31 ENCOUNTER — Other Ambulatory Visit: Payer: Self-pay | Admitting: Gastroenterology

## 2024-03-19 ENCOUNTER — Encounter: Payer: Self-pay | Admitting: Internal Medicine

## 2024-03-24 ENCOUNTER — Ambulatory Visit: Attending: Internal Medicine | Admitting: Internal Medicine

## 2024-03-24 ENCOUNTER — Encounter: Payer: Self-pay | Admitting: Internal Medicine

## 2024-03-24 VITALS — BP 114/72 | HR 97 | Ht 61.0 in | Wt 192.6 lb

## 2024-03-24 DIAGNOSIS — R002 Palpitations: Secondary | ICD-10-CM | POA: Diagnosis not present

## 2024-03-24 MED ORDER — SUCRALFATE 1 G PO TABS: 1.0000 g | ORAL_TABLET | ORAL | Status: AC | PRN

## 2024-03-24 MED ORDER — METOPROLOL TARTRATE 25 MG PO TABS: 12.5000 mg | ORAL_TABLET | Freq: Two times a day (BID) | ORAL | 3 refills | Status: DC

## 2024-03-24 NOTE — Patient Instructions (Signed)
 Medication Instructions:  Your physician has recommended you make the following change in your medication:  Start taking Metoprolol Tartrate 12.5 mg twice daily  Continue taking all other medications as prescribed  Labwork: None  Testing/Procedures: None  Follow-Up: Your physician recommends that you schedule a follow-up appointment in: 6 months  Any Other Special Instructions Will Be Listed Below (If Applicable). Thank you for choosing [facility]!     If you need a refill on your cardiac medications before your next appointment, please call your pharmacy.

## 2024-03-24 NOTE — Progress Notes (Unsigned)
 Cardiology Office Note  Date: 03/24/2024   ID: Meredith Mcclain, DOB 1995-09-02, MRN [id number]  PCP:  Shona Norleen PEDLAR, MD  Cardiologist:  Diannah SHAUNNA Maywood, MD Electrophysiologist:  None   History of Present Illness: Meredith Mcclain is a 28 y.o. female  Referred to cardiology clinic for evaluation of palpitations.  Patient reported ongoing palpitations, atypical in quality.  Vitals reviewed, HR more than 90 bpm for the last 6 months.  Does not have any other symptoms of chest pain, DOE, dizziness, syncope,  Past Medical History:  Diagnosis Date   Anxiety    Constipation    Depression    Endometriosis    Kidney stone    Ovarian cyst    PONV (postoperative nausea and vomiting)    TBI (traumatic brain injury) (HCC)     Past Surgical History:  Procedure Laterality Date   APPENDECTOMY     CESAREAN SECTION N/A 08/04/2020   Procedure: CESAREAN SECTION;  Surgeon: Gretta Gums, MD;  Location: [facility] LD ORS;  Service: Obstetrics;  Laterality: N/A;   COLONOSCOPY     LAPAROSCOPY      Current Outpatient Medications  Medication Sig Dispense Refill   buPROPion (WELLBUTRIN XL) 150 MG 24 hr tablet Take 150 mg by mouth daily.     busPIRone (BUSPAR) 7.5 MG tablet Take 7.5 mg by mouth 2 (two) times daily.     cetirizine (ZYRTEC) 10 MG tablet Take 1 tablet by mouth daily.     esomeprazole  (NEXIUM ) 40 MG capsule TAKE 1 CAPSULE (40 MG TOTAL) BY MOUTH 2 (TWO) TIMES DAILY BEFORE A MEAL. 180 capsule 1   ferrous sulfate 325 (65 FE) MG tablet Take 325 mg by mouth every other day.     magnesium  30 MG tablet Take 30 mg by mouth 2 (two) times daily.     metoprolol  tartrate (LOPRESSOR ) 25 MG tablet Take 0.5 tablets (12.5 mg total) by mouth 2 (two) times daily. 45 tablet 3   Multiple Vitamins-Minerals (MULTIVITAMIN ADULT PO) Microgestin 1/20 (21) 1 mg-20 mcg tablet     ondansetron  (ZOFRAN -ODT) 8 MG disintegrating tablet Take 1 tablet (8 mg total) by mouth daily as needed for nausea or vomiting. 20  tablet 1   sucralfate  (CARAFATE ) 1 g tablet Take 1 tablet (1 g total) by mouth as needed.     No current facility-administered medications for this visit.   Allergies:  Patient has no known allergies.   Social History: The patient  reports that she has never smoked. She has been exposed to tobacco smoke. She has never used smokeless tobacco. She reports current alcohol use. She reports that she does not use drugs.   Family History: The patient's family history includes Anxiety disorder in her maternal grandmother and mother; Asthma in her mother; Depression in her maternal grandmother; Diabetes in her paternal grandfather; Hypertension in her mother; Miscarriages / Stillbirths in her mother; Varicose Veins in her mother.   ROS:  Please see the history of present illness. Otherwise, complete review of systems is positive for none  All other systems are reviewed and negative.   Physical Exam: VS:  BP 114/72   Pulse 97   Ht 5' 1 (1.549 m)   Wt 192 lb 9.6 oz (87.4 kg)   SpO2 99%   BMI 36.39 kg/m , BMI Body mass index is 36.39 kg/m.  Wt Readings from Last 3 Encounters:  03/24/24 192 lb 9.6 oz (87.4 kg)  08/07/23 209 lb (94.8 kg)  06/11/23  212 lb 3.2 oz (96.3 kg)    General: Patient appears comfortable at rest. HEENT: Conjunctiva and lids normal, oropharynx clear with moist mucosa. Neck: Supple, no elevated JVP or carotid bruits, no thyromegaly. Lungs: Clear to auscultation, nonlabored breathing at rest. Cardiac: Regular rate and rhythm, no S3 or significant systolic murmur, no pericardial rub. Abdomen: Soft, nontender, no hepatomegaly, bowel sounds present, no guarding or rebound. Extremities: No pitting edema, distal pulses 2+. Skin: Warm and dry. Musculoskeletal: No kyphosis. Neuropsychiatric: Alert and oriented x3, affect grossly appropriate.  Recent Labwork: 06/10/2023: ALT 30; AST 24; BUN 10; Creatinine, Ser 0.66; Potassium 3.7; Sodium 138 08/14/2023: Hemoglobin 12.2;  Platelets 470  No results found for: CHOL, TRIG, HDL, CHOLHDL, VLDL, LDLCALC, LDLDIRECT   Assessment and Plan:  Palpitations - HR more than 90 bpm in the last 6 months per review of vitals. - Symptomatic with palpitations, atypical in quality. - Start metoprolol  tartrate 12.5 mg twice daily for symptomatic relief. - Discussed side effects of metoprolol . - Treat underlying anxiety.  30 minutes spent in review the prior records, imaging, more than 3 labs, discussion of the above problem and documentation.     Medication Adjustments/Labs and Tests Ordered: Current medicines are reviewed at length with the patient today.  Concerns regarding medicines are outlined above.    Disposition:  Follow up 6 months  Signed Sabiha Sura Priya Joydan Gretzinger, MD, 03/24/2024 4:43 PM    [facility] [address] [place], [place], [place] [address]

## 2024-03-27 DIAGNOSIS — R002 Palpitations: Secondary | ICD-10-CM | POA: Insufficient documentation

## 2024-04-22 ENCOUNTER — Encounter: Payer: Self-pay | Admitting: Internal Medicine

## 2024-04-24 ENCOUNTER — Other Ambulatory Visit: Payer: Self-pay

## 2024-04-24 ENCOUNTER — Other Ambulatory Visit

## 2024-04-24 DIAGNOSIS — R001 Bradycardia, unspecified: Secondary | ICD-10-CM

## 2024-05-21 ENCOUNTER — Telehealth: Payer: Self-pay | Admitting: Internal Medicine

## 2024-05-21 ENCOUNTER — Encounter: Payer: Self-pay | Admitting: Internal Medicine

## 2024-05-21 NOTE — Telephone Encounter (Signed)
 Pt c/o BP issue: STAT if pt c/o blurred vision, one-sided weakness or slurred speech.  STAT if BP is GREATER than 180/120 TODAY.  STAT if BP is LESS than 90/60 and SYMPTOMATIC TODAY  1. What is your BP concern? BP elevated  2. Have you taken any BP medication today? No  3. What are your last 5 BP readings? 152/103 152/93 148/92  4. Are you having any other symptoms (ex. Dizziness, headache, blurred vision, passed out)? Weak, discomfort on right side of neck and right arm feels numb

## 2024-05-22 NOTE — Telephone Encounter (Signed)
 Reports elevated BP on yesterday. See readings: 152/103; 152/93; 148/92. Reports HR was around 116 when her BP was elevated. Reports checked later on and was 130/88 & HR 100.  Reports she was examined by a medic at work yesterday. Reports going to [facility] ED but did not register due to wait times. Reports she feels better today but has not checked her BP. Reports intermittent, headache rated 5/10. Reports she has not taken any pain medication. Denies active headache. Medications reviewed. Reports she was holding metoprolol  while wearing the heart monitor in which was mailed back last week. Reports she has not restarted metoprolol . Advised that she can resume metoprolol  at previous dose. Advised to continue monitoring her BP and contact PCP if it remains elevated. Advised if she develops worsening symptoms, to get an ED evaluation. Verbalized understanding of plan.

## 2024-05-22 NOTE — Telephone Encounter (Signed)
 Patient is returning call.

## 2024-06-05 ENCOUNTER — Other Ambulatory Visit: Payer: Self-pay | Admitting: Gastroenterology

## 2024-06-05 DIAGNOSIS — R001 Bradycardia, unspecified: Secondary | ICD-10-CM | POA: Diagnosis not present

## 2024-06-15 ENCOUNTER — Ambulatory Visit: Payer: Self-pay | Admitting: Internal Medicine

## 2024-06-15 MED ORDER — METOPROLOL TARTRATE 25 MG PO TABS: 25.0000 mg | ORAL_TABLET | Freq: Two times a day (BID) | ORAL | 3 refills | Status: AC

## 2024-06-15 NOTE — Telephone Encounter (Signed)
-----   Message from Vishnu P Mallipeddi sent at 06/15/2024  3:02 PM EST ----- Average HR 91 bpm. Symptoms correlated with NSR. Can increase metoprolol  tartarate from 12.5 mg to 25 mg BID for symptomatic relief. If this is not working well, can switch to propranolol 20 mg TID.  Hold for BP<100 SBP and HR< 60 bpm. ----- Message ----- From: Stacia Diannah SQUIBB, MD Sent: 06/05/2024   1:39 PM EST To: Vishnu P Mallipeddi, MD

## 2024-06-15 NOTE — Telephone Encounter (Signed)
 The patient has been notified of the result and verbalized understanding.  All questions (if any) were answered. Rosina JAYSON Cornea, CMA 06/15/2024 3:15 PM
